# Patient Record
Sex: Male | Born: 1980
Health system: Southern US, Community
[De-identification: ages and names within clinical notes are randomized; demographics above are authoritative.]

## PROBLEM LIST (undated history)

## (undated) DIAGNOSIS — R42 Dizziness and giddiness: Secondary | ICD-10-CM

## (undated) DIAGNOSIS — R609 Edema, unspecified: Secondary | ICD-10-CM

## (undated) DIAGNOSIS — K219 Gastro-esophageal reflux disease without esophagitis: Secondary | ICD-10-CM

## (undated) HISTORY — DX: Edema, unspecified: R60.9

## (undated) HISTORY — PX: ADENOIDECTOMY: SUR15

## (undated) HISTORY — DX: Dizziness and giddiness: R42

## (undated) HISTORY — DX: Gastro-esophageal reflux disease without esophagitis: K21.9

## (undated) HISTORY — PX: TONSILLECTOMY: SUR1361

## (undated) HISTORY — DX: Morbid (severe) obesity due to excess calories: E66.01

---

## 2011-09-26 ENCOUNTER — Emergency Department (HOSPITAL_COMMUNITY): Payer: 59

## 2011-09-26 ENCOUNTER — Encounter (HOSPITAL_COMMUNITY): Payer: Self-pay | Admitting: *Deleted

## 2011-09-26 ENCOUNTER — Emergency Department (HOSPITAL_COMMUNITY)
Admission: EM | Admit: 2011-09-26 | Discharge: 2011-09-26 | Disposition: A | Discharge status: Home / Self Care | Payer: 59 | Attending: Emergency Medicine | Admitting: Emergency Medicine

## 2011-09-26 DIAGNOSIS — R110 Nausea: Secondary | ICD-10-CM | POA: Insufficient documentation

## 2011-09-26 DIAGNOSIS — R079 Chest pain, unspecified: Secondary | ICD-10-CM | POA: Insufficient documentation

## 2011-09-26 DIAGNOSIS — M25519 Pain in unspecified shoulder: Secondary | ICD-10-CM | POA: Insufficient documentation

## 2011-09-26 LAB — BASIC METABOLIC PANEL
BUN: 17 mg/dL (ref 6–23)
CO2: 21 mEq/L (ref 19–32)
Calcium: 9.2 mg/dL (ref 8.4–10.5)
Creatinine, Ser: 0.75 mg/dL (ref 0.50–1.35)
GFR calc non Af Amer: >90 mL/min (ref >90)
Glucose, Bld: 94 mg/dL (ref 70–99)

## 2011-09-26 LAB — DIFFERENTIAL
Basophils Absolute: 0 10*3/uL (ref 0.0–0.1)
Basophils Relative: 1 % (ref 0–1)
Eosinophils Absolute: 0.2 10*3/uL (ref 0.0–0.7)
Eosinophils Relative: 2 % (ref 0–5)
Monocytes Absolute: 0.5 10*3/uL (ref 0.1–1.0)
Monocytes Relative: 6 % (ref 3–12)

## 2011-09-26 LAB — CBC
HCT: 45.7 % (ref 39.0–52.0)
Hemoglobin: 16.3 g/dL (ref 13.0–17.0)
MCH: 32 pg (ref 26.0–34.0)
MCHC: 35.7 g/dL (ref 30.0–36.0)
RDW: 13.2 % (ref 11.5–15.5)

## 2011-09-26 NOTE — Discharge Instructions (Signed)
Aspirin and Your Heart Aspirin affects the way your blood clots and helps "thin" the blood. Aspirin has many uses in heart disease. It may be used as a primary prevention to help reduce the risk of heart related events. It also can be used as a secondary measure to prevent more heart attacks or to prevent additional damage from blood clots.  ASPIRIN MAY HELP IF YOU:  Have had a heart attack or chest pain.   Have undergone open heart surgery such as CABG (Coronary Artery Bypass Surgery).   Have had coronary angioplasty with or without stents.   Have experienced a stroke or TIA (transient ischemic attack).   Have peripheral vascular disease (PAD).   Have chronic heart rhythm problems such as atrial fibrillation.   Are at risk for heart disease.  BEFORE STARTING ASPIRIN Before you start taking aspirin, your caregiver will need to review your medical history. Many things will need to be taken into consideration, such as:  Smoking status.   Blood pressure.   Diabetes.   Gender.   Weight.   Cholesterol level.  ASPIRIN DOSES  Aspirin should only be taken on the advice of your caregiver. Talk to your caregiver about how much aspirin you should take. Aspirin comes in different doses such as:   81 mg.   162 mg.   325 mg.   The aspirin dose you take may be affected by many factors, some of which include:   Your current medications, especially if your are taking blood-thinners or anti-platelet medicine.   Liver function.   Heart disease risk.   Age.   Aspirin comes in two forms:   Non-enteric-coated. This type of aspirin does not have a coating and is absorbed faster. Non-enteric coated aspirin is recommended for patients experiencing chest pain symptoms. This type of aspirin also comes in a chewable form.   Enteric-coated. This means the aspirin has a special coating that releases the medicine very slowly. Enteric-coated aspirin causes less stomach upset. This type of  aspirin should not be chewed or crushed.  ASPIRIN SIDE EFFECTS Daily use of aspirin can increase your risk of serious side effects, some of these include:  Increased bleeding. This can range from a cut that does not stop bleeding to more serious problems such as stomach bleeding or bleeding into the brain (Intracerebral bleeding).   Increased bruising.   Stomach upset.   An allergic reaction such as red, itchy skin.   Increased risk of bleeding when combined with non-steroidal anti-inflammatory medicine (NSAIDS).   Alcohol should be drank in moderation when taking aspirin. Alcohol can increase the risk of stomach bleeding when taken with aspirin.   Aspirin should not be given to children less than 18 years of age due to the association of Reye syndrome. Reye syndrome is a serious illness that can affect the brain and liver. Studies have linked Reye syndrome with aspirin use in children.   People that have nasal polyps have an increased risk of developing an aspirin allergy.  SEEK MEDICAL CARE IF:   You develop an allergic reaction such as:   Hives.   Itchy skin.   Swelling of the lips, tongue or face.   You develop stomach pain.   You have unusual bleeding or bruising.   You have ringing in your ears.  SEEK IMMEDIATE MEDICAL CARE IF:   You have severe chest pain, especially if the pain is crushing or pressure-like and spreads to the arms, back, neck, or jaw. THIS   IS AN EMERGENCY. Do not wait to see if the pain will go away. Get medical help at once. Call your local emergency services (911 in the [place] DO NOT drive yourself to the hospital.   You have stroke-like symptoms such as:   Loss of vision.   Difficulty talking.   Numbness or weakness on one side of your body.   Numbness or weakness in your arm or leg.   Not thinking clearly or feeling confused.   Your bowel movements are bloody, dark red or black in color.   You vomit or cough up blood.   You have blood  in your urine.   You have shortness of breath, coughing or wheezing.  MAKE SURE YOU:   Understand these instructions.   Will monitor your condition.   Seek immediate medical care if necessary.  Document Released: 05/06/2008 Document Revised: 05/13/2011 Document Reviewed: 05/06/2008 [facility] Patient Information 2012 [place], [place].Chest Pain, Nonspecific It is often hard to give a specific diagnosis for the cause of chest pain. There is always a chance that your pain could be related to something serious, like a heart attack or a blood clot in the lungs. You need to follow up with your caregiver for further evaluation. More lab tests or other studies such as X-rays, electrocardiography, stress testing, or cardiac imaging may be needed to find the cause of your pain. Most of the time, nonspecific chest pain improves within 2 to 3 days with rest and mild pain medicine. For the next few days, avoid physical exertion or activities that bring on pain. Do not smoke. Avoid drinking alcohol. Call your caregiver for routine follow-up as advised.  SEEK IMMEDIATE MEDICAL CARE IF:  You develop increased chest pain or pain that radiates to the arm, neck, jaw, back, or abdomen.   You develop shortness of breath, increased coughing, or you start coughing up blood.   You have severe back or abdominal pain, nausea, or vomiting.   You develop severe weakness, fainting, fever, or chills.  Document Released: 05/24/2005 Document Revised: 05/13/2011 Document Reviewed: 11/11/2006 [facility] Patient Information 2012 [place], [place].

## 2011-09-26 NOTE — ED Notes (Signed)
Patient transported to X-ray 

## 2011-09-26 NOTE — ED Notes (Signed)
Returned from radiology. 

## 2011-09-26 NOTE — ED Notes (Signed)
Reports having onset of diaphoresis, dizziness and left side chest cramping. Pain has decreased pta, skin w/d. resp e/u at triage, ekg done.

## 2011-09-26 NOTE — ED Provider Notes (Addendum)
History     CSN: [id number]  Arrival date & time 09/26/11  1351   First MD Initiated Contact with Patient 09/26/11 1449      Chief Complaint  Patient presents with  . Chest Pain    (Consider location/radiation/quality/duration/timing/severity/associated sxs/prior treatment) Patient is a 31 y.o. male presenting with chest pain. The history is provided by the patient.  Chest Pain The chest pain began 3 - 5 hours ago. Chest pain occurs constantly. The chest pain is improving. At its most intense, the pain is at 8/10. The pain is currently at 1/10. The severity of the pain is moderate. The quality of the pain is described as aching (Feels like a cramp). The pain does not radiate. Exacerbated by: Started about one hour after he ate while he was walking around. He became lightheaded and sweaty. Primary symptoms include nausea and dizziness. Pertinent negatives for primary symptoms include no fever, no shortness of breath, no cough, no wheezing, no palpitations, no abdominal pain and no vomiting.  Dizziness also occurs with nausea and diaphoresis. Dizziness does not occur with vomiting.  Associated symptoms include diaphoresis and near-syncope. He tried aspirin for the symptoms. Risk factors include smoking/tobacco exposure.  Pertinent negatives for past medical history include no CAD, no hyperlipidemia and no hypertension.  His family medical history is significant for CAD in family and early MI in family.     History reviewed. No pertinent past medical history.  History reviewed. No pertinent past surgical history.  History reviewed. No pertinent family history.  History  Substance Use Topics  . Smoking status: Current Everyday Smoker -- 1.0 packs/day    Types: Cigarettes  . Smokeless tobacco: Not on file  . Alcohol Use: Yes     occ      Review of Systems  Constitutional: Positive for diaphoresis. Negative for fever.  Respiratory: Negative for cough, shortness of breath and  wheezing.   Cardiovascular: Positive for chest pain and near-syncope. Negative for palpitations.  Gastrointestinal: Positive for nausea. Negative for vomiting and abdominal pain.  Neurological: Positive for dizziness.  All other systems reviewed and are negative.    Allergies  Doxycycline  Home Medications   Current Outpatient Rx  Name Route Sig Dispense Refill  . ASPIRIN 325 MG PO TABS Oral Take 325 mg by mouth once.    . IBUPROFEN 200 MG PO TABS Oral Take 200 mg by mouth every 6 (six) hours as needed. For pain.    . ADULT MULTIVITAMIN W/MINERALS CH Oral Take 1 tablet by mouth daily.      BP 174/104  Pulse 107  Temp(Src) 97.5 F (36.4 C) (Oral)  Resp 18  SpO2 96%  Physical Exam  Nursing note and vitals reviewed. Constitutional: He is oriented to person, place, and time. He appears well-developed and well-nourished. No distress.  HENT:  Head: Normocephalic and atraumatic.  Mouth/Throat: Oropharynx is clear and moist.  Eyes: Conjunctivae and EOM are normal. Pupils are equal, round, and reactive to light.  Neck: Normal range of motion. Neck supple.  Cardiovascular: Normal rate, regular rhythm and intact distal pulses.   No murmur heard. Pulmonary/Chest: Effort normal and breath sounds normal. No respiratory distress. He has no wheezes. He has no rales.  Abdominal: Soft. He exhibits no distension. There is no tenderness. There is no rebound and no guarding.  Musculoskeletal: Normal range of motion. He exhibits no edema and no tenderness.  Neurological: He is alert and oriented to person, place, and time.  Skin: Skin  is warm and dry. No rash noted. No erythema.  Psychiatric: He has a normal mood and affect. His behavior is normal.    ED Course  Procedures (including critical care time)  Labs Reviewed  BASIC METABOLIC PANEL - Abnormal; Notable for the following:    Sodium 128 (*)    Potassium >7.5 (*)    All other components within normal limits  CBC  DIFFERENTIAL    POCT I-STAT TROPONIN I   Dg Chest 2 View  09/26/2011  *RADIOLOGY REPORT*  Clinical Data: Left-sided chest and shoulder pain.  Dizziness and swelling.  History of smoking.  CHEST - 2 VIEW  Comparison: None.  Findings: Heart size is upper limits normal.  The lungs are free of focal consolidations and pleural effusions.  There is minimal left base atelectasis.  No edema. Visualized osseous structures have a normal appearance.  IMPRESSION:  1.  Minimal left lower lobe atelectasis and/or early infiltrate. 2.  No edema.  Original Report Authenticated By: Patterson Hammersmith, M.D.     Date: 09/26/2011  Rate: 101  Rhythm: sinus tachycardia  QRS Axis: normal  Intervals: normal  ST/T Wave abnormalities: normal  Conduction Disutrbances:none  Narrative Interpretation:   Old EKG Reviewed: none available    1. Chest pain       MDM   Pt with symptoms concerning for ACS today with chest pain, diaphoresis and near-syncope.  TIMI 0 however has a strong family history of multiple people in his family including both parents had heart attacks and heart disease.  PERC negative.  Patient is low risk however he has a good story. Recommended he do the low risk chest pain protocol. Patient denies any drug use and does not use muscle enhancing medications EKG within normal limits, CXR, CBC, BMP, CE pending.  Troponin negative. CBC within normal limits. Chest x-ray within normal limits. BMP was hemolyzed however has a normal creatinine feel that the hyperkalemia is to to the blood being hemolyzed. Patient had multiple samples overall hemolyzed. Also patient had a high content of lipids and his blood which is yet another risk factor. All of this was discussed with him including his test results. I strongly urged him to go into the low risk chest pain protocol for coronary CT however he refused and wanted to go home. He understands that he could have a problem with his heart that to get worse but still chooses to go  home and followup as an outpatient.       Gwyneth Sprout, MD 09/26/11 1714  Gwyneth Sprout, MD 09/26/11 [phone]

## 2011-09-28 ENCOUNTER — Encounter: Payer: Self-pay | Admitting: Cardiovascular Disease

## 2011-09-28 ENCOUNTER — Ambulatory Visit (INDEPENDENT_AMBULATORY_CARE_PROVIDER_SITE_OTHER): Payer: 59 | Admitting: Cardiovascular Disease

## 2011-09-28 ENCOUNTER — Encounter: Payer: Self-pay | Admitting: *Deleted

## 2011-09-28 DIAGNOSIS — I10 Essential (primary) hypertension: Secondary | ICD-10-CM

## 2011-09-28 DIAGNOSIS — F1010 Alcohol abuse, uncomplicated: Secondary | ICD-10-CM | POA: Insufficient documentation

## 2011-09-28 DIAGNOSIS — R079 Chest pain, unspecified: Secondary | ICD-10-CM

## 2011-09-28 DIAGNOSIS — I200 Unstable angina: Secondary | ICD-10-CM | POA: Insufficient documentation

## 2011-09-28 DIAGNOSIS — F17200 Nicotine dependence, unspecified, uncomplicated: Secondary | ICD-10-CM | POA: Insufficient documentation

## 2011-09-28 HISTORY — DX: Essential (primary) hypertension: I10

## 2011-09-28 LAB — BASIC METABOLIC PANEL
BUN: 19 mg/dL (ref 6–23)
Creatinine, Ser: 0.9 mg/dL (ref 0.4–1.5)
GFR: 107.82 mL/min (ref >60.00)
Glucose, Bld: 80 mg/dL (ref 70–99)
Potassium: 3.8 mEq/L (ref 3.5–5.1)

## 2011-09-28 MED ORDER — LOSARTAN POTASSIUM 25 MG PO TABS: 25.0000 mg | ORAL_TABLET | Freq: Every day | ORAL | Status: DC

## 2011-09-28 NOTE — Assessment & Plan Note (Signed)
Counseled on cessation for less than 10 minutes.  Indicated relationship between nicotine and HTN

## 2011-09-28 NOTE — Assessment & Plan Note (Addendum)
Not abuse.  Epic has no category for ETOH use:  However he drinks most days and I indicated this would make his BP harder to control

## 2011-09-28 NOTE — Patient Instructions (Addendum)
Your physician has requested that you have an exercise tolerance test in 2-3 weeks. For further information please visit [email or web address] Please also follow instruction sheet, as given.  BMET today.  Start Cozaar 25mg  daily.  Your physician recommends that you schedule a follow-up appointment in: 3 months with Dr. Eden Emms.

## 2011-09-28 NOTE — Assessment & Plan Note (Signed)
Atypical R/O in ER Normal ECG  F/U ETT once on BP meds

## 2011-09-28 NOTE — Assessment & Plan Note (Signed)
Repeat BMET that had low sodium and high K in ER.  Cozaar 25 mg if BMET ok

## 2011-09-28 NOTE — Progress Notes (Signed)
Patient ID: Jacob Carlson, male   DOB: Jul 07, 1980, 31 y.o.   MRN: [id number] 31 yo with no PCP.  Seen in ER 2 days ago for SSCP.  R/O with no ECG changes.  Reviewed ER notes.  Declined to by entered into chest pain protocol.  Of note Na/K were abnormal and attributed to hemolysis.  Pain started after eating at [employer].  Had diaphoresis.  No dyspnea.  Heart was pounding.  Positive family history with both parents MI's in 80's.  Still with mild residual left pectoral pain.  No trauma or muscle strains.  No other GI symptoms.  Denies drugs.  Does drink most days and more on weekends.  Smokes 1-1.5 ppd  ROS: Denies fever, malais, weight loss, blurry vision, decreased visual acuity, cough, sputum, SOB, hemoptysis, pleuritic pain, palpitaitons, heartburn, abdominal pain, melena, lower extremity edema, claudication, or rash.  All other systems reviewed and negative   General: Affect appropriate Healthy:  appears stated age HEENT: normal Neck supple with no adenopathy JVP normal no bruits no thyromegaly Lungs clear with no wheezing and good diaphragmatic motion Heart:  S1/S2 no murmur,rub, gallop or click PMI normal Abdomen: benighn, BS positve, no tenderness, no AAA no bruit.  No HSM or HJR Distal pulses intact with no bruits No edema Neuro non-focal Skin warm and dry No muscular weakness  Medications Current Outpatient Prescriptions  Medication Sig Dispense Refill  . aspirin 325 MG tablet Take 325 mg by mouth once.      Marland Kitchen ibuprofen (ADVIL,MOTRIN) 200 MG tablet Take 200 mg by mouth every 6 (six) hours as needed. For pain.      . Multiple Vitamin (MULITIVITAMIN WITH MINERALS) TABS Take 1 tablet by mouth daily.        Allergies Doxycycline  Family History: Family History  Problem Relation Age of Onset  . Heart attack Father   . Heart block Father   . Heart disease Mother     Social History: History   Social History  . Marital Status: Significant Other    Spouse Name: N/A   Number of Children: N/A  . Years of Education: N/A   Occupational History  . Not on file.   Social History Main Topics  . Smoking status: Current Everyday Smoker -- 1.0 packs/day    Types: Cigarettes  . Smokeless tobacco: Not on file  . Alcohol Use: Yes     occ  . Drug Use: No  . Sexually Active:    Other Topics Concern  . Not on file   Social History Narrative  . No narrative on file    Electrocardiogram:  09/26/11 NSR normal ECG  Assessment and Plan

## 2011-10-22 ENCOUNTER — Ambulatory Visit (INDEPENDENT_AMBULATORY_CARE_PROVIDER_SITE_OTHER): Payer: 59 | Admitting: Physician Assistant

## 2011-10-22 ENCOUNTER — Encounter: Payer: Self-pay | Admitting: Physician Assistant

## 2011-10-22 DIAGNOSIS — R079 Chest pain, unspecified: Secondary | ICD-10-CM

## 2011-10-22 NOTE — Procedures (Signed)
Exercise Treadmill Test  Pre-Exercise Testing Evaluation Rhythm: sinus tachycardia  Rate: 104   PR:  .12 QRS:  .08  QT:  .32 QTc: .43     Test  Exercise Tolerance Test Ordering MD: Charlton Haws, MD  Interpreting MD: Tereso Newcomer PA-C  Unique Test No: 1  Treadmill:  1  Indication for ETT: chest pain - rule out ischemia  Contraindication to ETT: No   Stress Modality: exercise - treadmill  Cardiac Imaging Performed: non   Protocol: standard Bruce - maximal  Max BP:  182/48  Max MPHR (bpm):  190 85% MPR (bpm):  161  MPHR obtained (bpm):  169 % MPHR obtained:  88%  Reached 85% MPHR (min:sec):  7:58 Total Exercise Time (min-sec):  9:00  Workload in METS:  10.1 Borg Scale: 15  Reason ETT Terminated:  patient's desire to stop    ST Segment Analysis At Rest: normal ST segments - no evidence of significant ST depression With Exercise: no evidence of significant ST depression  Other Information Arrhythmia:  No Angina during ETT:  absent (0) Quality of ETT:  diagnostic  ETT Interpretation:  normal - no evidence of ischemia by ST analysis  Comments: Good exercise tolerance. No chest pain. Normal BP response to exercise. No ST-T changes to suggest ischemia.   Recommendations: Follow up with Dr. Charlton Haws as directed. Tereso Newcomer, PA-C  12:21 PM 10/22/2011

## 2011-12-23 ENCOUNTER — Ambulatory Visit: Payer: 59 | Admitting: Cardiovascular Disease

## 2012-03-04 DIAGNOSIS — E782 Mixed hyperlipidemia: Secondary | ICD-10-CM

## 2012-03-04 HISTORY — DX: Mixed hyperlipidemia: E78.2

## 2013-04-21 IMAGING — CR DG CHEST 2V
2 series · 2 of 2 positions shown · non-contrast
Comparison: None.

CLINICAL DATA: Left-sided chest and shoulder pain.  Dizziness and
swelling.  History of smoking.

CHEST - 2 VIEW

[w chest pa]
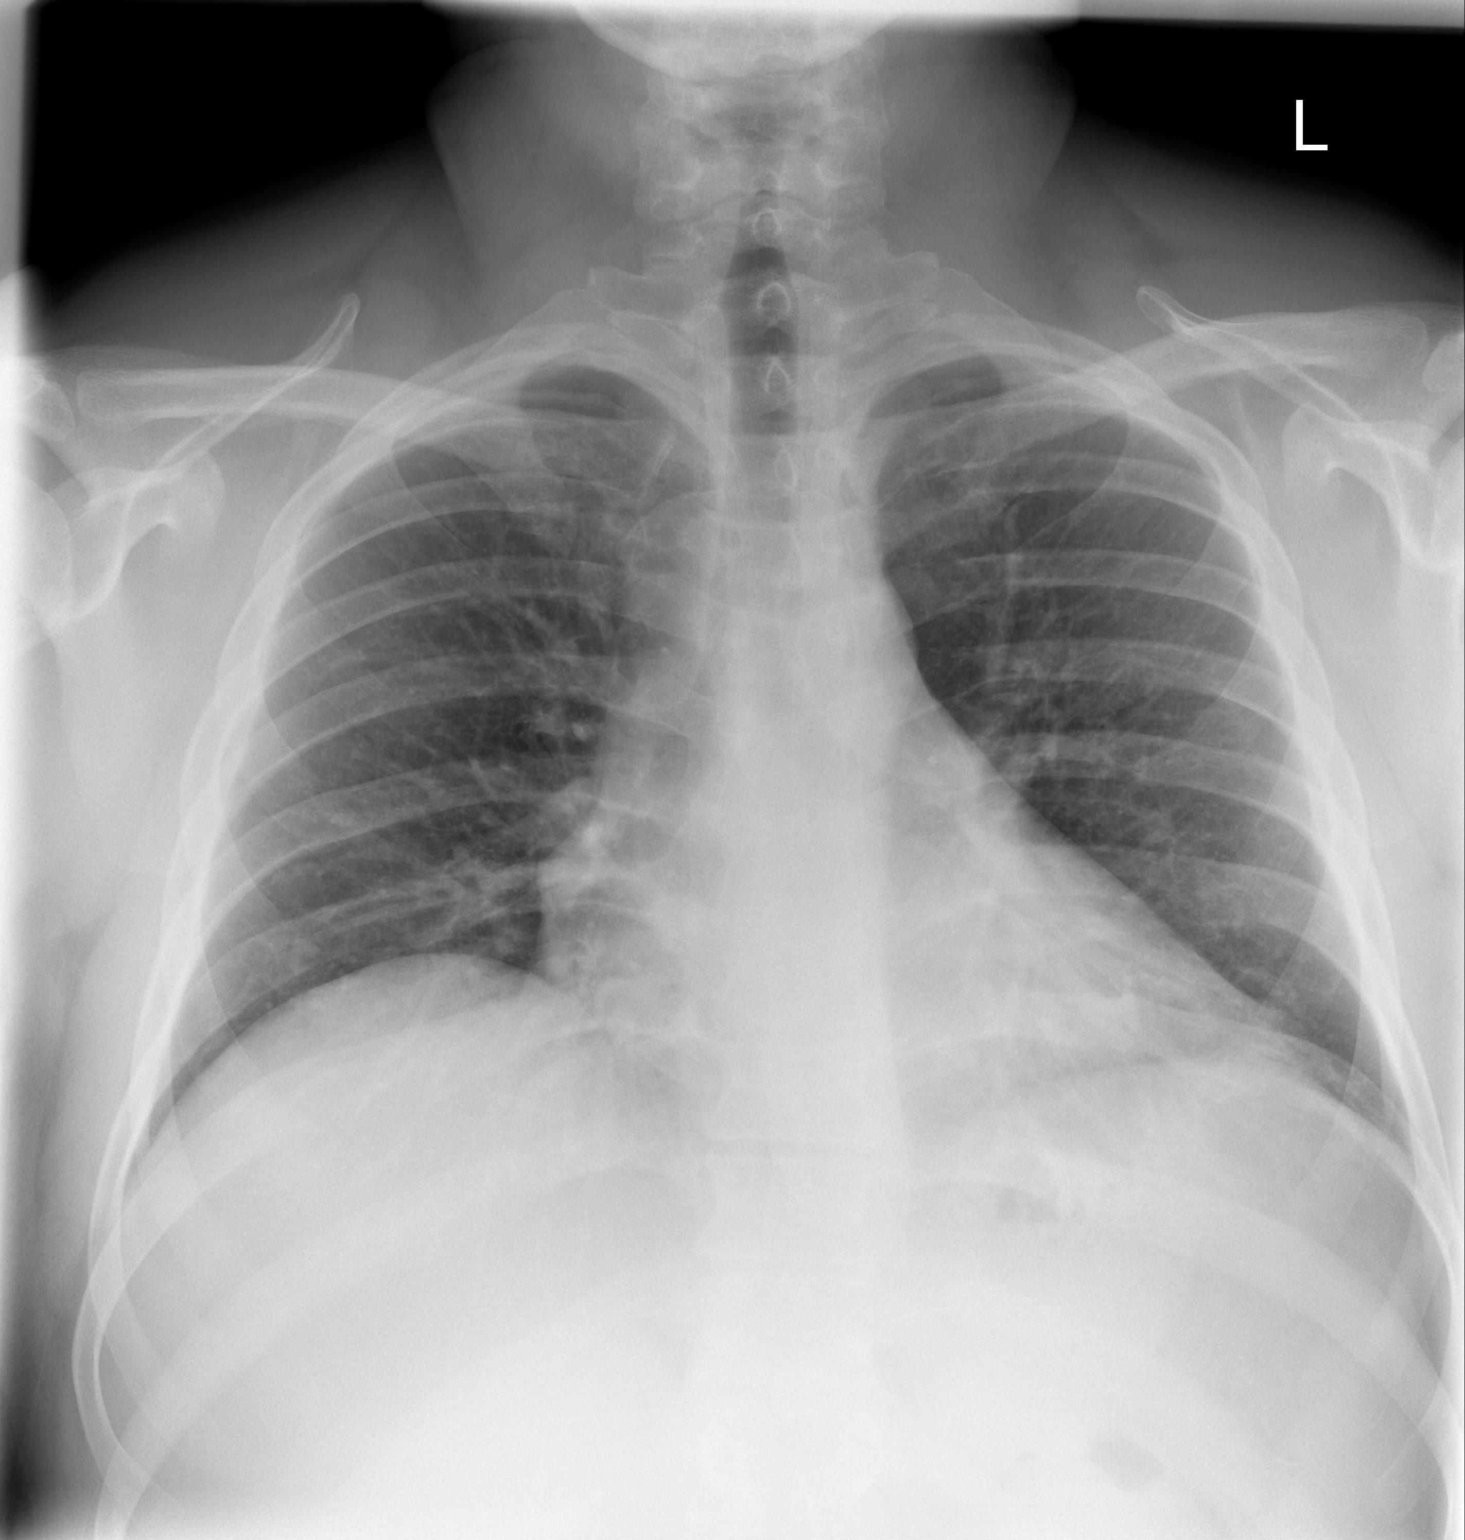

[w chest lat]
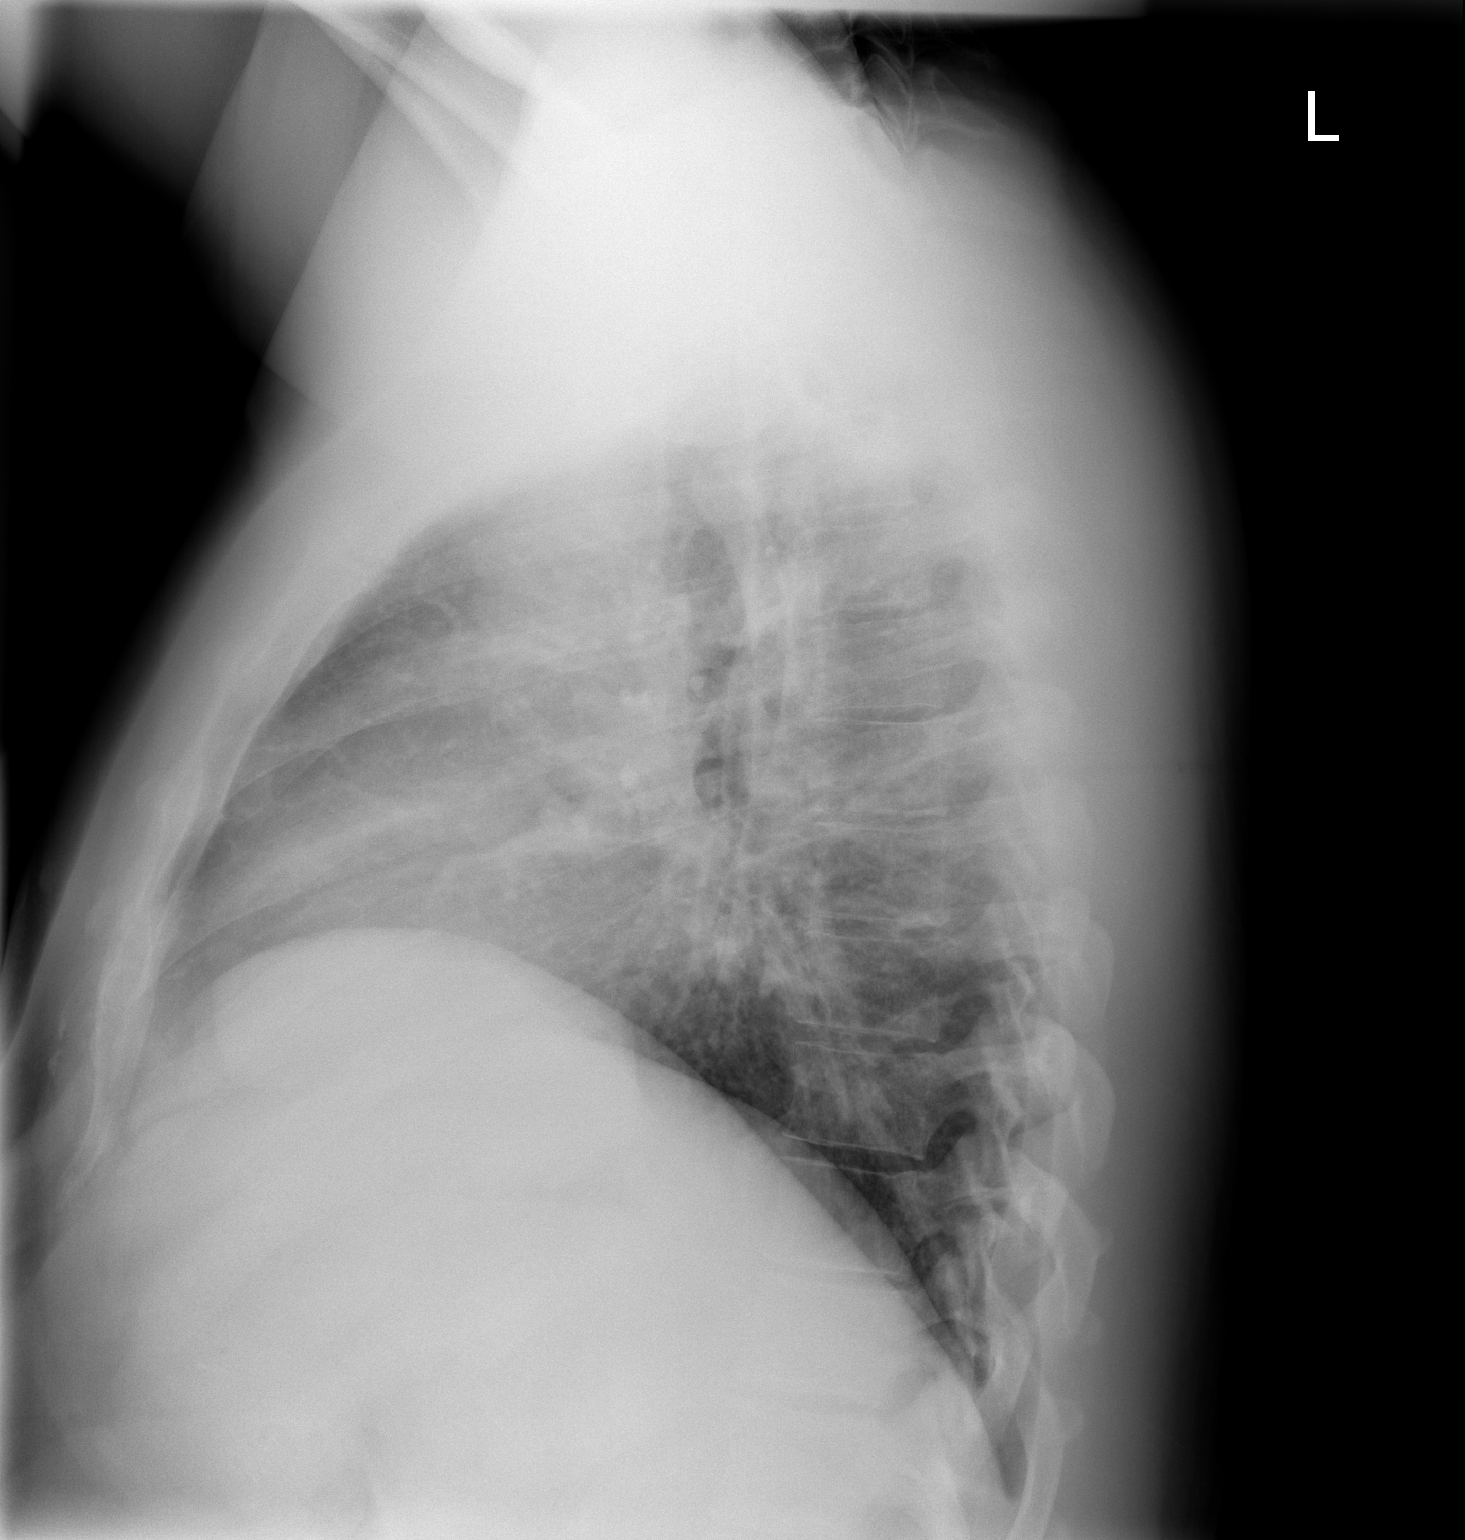

[2 of 2 positions shown; findings below may reference images not displayed]

FINDINGS: Heart size is upper limits normal.  The lungs are free of
focal consolidations and pleural effusions.  There is minimal left
base atelectasis.  No edema. Visualized osseous structures have a
normal appearance.
IMPRESSION: 1.  Minimal left lower lobe atelectasis and/or early infiltrate.
2.  No edema.

## 2017-09-16 DIAGNOSIS — Z0000 Encounter for general adult medical examination without abnormal findings: Secondary | ICD-10-CM | POA: Diagnosis not present

## 2017-09-16 DIAGNOSIS — I10 Essential (primary) hypertension: Secondary | ICD-10-CM | POA: Diagnosis not present

## 2017-09-16 DIAGNOSIS — E781 Pure hyperglyceridemia: Secondary | ICD-10-CM | POA: Diagnosis not present

## 2017-09-16 DIAGNOSIS — E782 Mixed hyperlipidemia: Secondary | ICD-10-CM | POA: Diagnosis not present

## 2018-04-09 DIAGNOSIS — Z23 Encounter for immunization: Secondary | ICD-10-CM | POA: Diagnosis not present

## 2018-05-13 DIAGNOSIS — M9903 Segmental and somatic dysfunction of lumbar region: Secondary | ICD-10-CM | POA: Diagnosis not present

## 2018-05-13 DIAGNOSIS — M62830 Muscle spasm of back: Secondary | ICD-10-CM | POA: Diagnosis not present

## 2018-05-13 DIAGNOSIS — M5442 Lumbago with sciatica, left side: Secondary | ICD-10-CM | POA: Diagnosis not present

## 2018-05-15 DIAGNOSIS — M62830 Muscle spasm of back: Secondary | ICD-10-CM | POA: Diagnosis not present

## 2018-05-15 DIAGNOSIS — M5442 Lumbago with sciatica, left side: Secondary | ICD-10-CM | POA: Diagnosis not present

## 2018-05-15 DIAGNOSIS — M9903 Segmental and somatic dysfunction of lumbar region: Secondary | ICD-10-CM | POA: Diagnosis not present

## 2018-05-16 DIAGNOSIS — M62830 Muscle spasm of back: Secondary | ICD-10-CM | POA: Diagnosis not present

## 2018-05-16 DIAGNOSIS — M5442 Lumbago with sciatica, left side: Secondary | ICD-10-CM | POA: Diagnosis not present

## 2018-05-16 DIAGNOSIS — M9903 Segmental and somatic dysfunction of lumbar region: Secondary | ICD-10-CM | POA: Diagnosis not present

## 2018-05-18 DIAGNOSIS — M5442 Lumbago with sciatica, left side: Secondary | ICD-10-CM | POA: Diagnosis not present

## 2018-05-18 DIAGNOSIS — M62830 Muscle spasm of back: Secondary | ICD-10-CM | POA: Diagnosis not present

## 2018-05-18 DIAGNOSIS — M9903 Segmental and somatic dysfunction of lumbar region: Secondary | ICD-10-CM | POA: Diagnosis not present

## 2018-05-23 DIAGNOSIS — M62830 Muscle spasm of back: Secondary | ICD-10-CM | POA: Diagnosis not present

## 2018-05-23 DIAGNOSIS — M5442 Lumbago with sciatica, left side: Secondary | ICD-10-CM | POA: Diagnosis not present

## 2018-05-23 DIAGNOSIS — M9903 Segmental and somatic dysfunction of lumbar region: Secondary | ICD-10-CM | POA: Diagnosis not present

## 2022-03-17 ENCOUNTER — Ambulatory Visit: Payer: 59 | Attending: Cardiology | Admitting: Cardiology

## 2022-03-17 ENCOUNTER — Encounter: Payer: Self-pay | Admitting: Cardiology

## 2022-03-17 VITALS — BP 146/100 | HR 81 | Ht 72.0 in | Wt 327.6 lb

## 2022-03-17 DIAGNOSIS — I200 Unstable angina: Secondary | ICD-10-CM | POA: Diagnosis not present

## 2022-03-17 DIAGNOSIS — I10 Essential (primary) hypertension: Secondary | ICD-10-CM | POA: Diagnosis not present

## 2022-03-17 DIAGNOSIS — R0609 Other forms of dyspnea: Secondary | ICD-10-CM

## 2022-03-17 DIAGNOSIS — Z6841 Body Mass Index (BMI) 40.0 and over, adult: Secondary | ICD-10-CM

## 2022-03-17 DIAGNOSIS — F1010 Alcohol abuse, uncomplicated: Secondary | ICD-10-CM

## 2022-03-17 DIAGNOSIS — E782 Mixed hyperlipidemia: Secondary | ICD-10-CM | POA: Insufficient documentation

## 2022-03-17 DIAGNOSIS — F17200 Nicotine dependence, unspecified, uncomplicated: Secondary | ICD-10-CM

## 2022-03-17 DIAGNOSIS — Z8249 Family history of ischemic heart disease and other diseases of the circulatory system: Secondary | ICD-10-CM | POA: Insufficient documentation

## 2022-03-17 LAB — CBC
Hematocrit: 46.9 % (ref 37.5–51.0)
Hemoglobin: 16.1 g/dL (ref 13.0–17.7)
MCH: 29.5 pg (ref 26.6–33.0)
MCHC: 34.3 g/dL (ref 31.5–35.7)
MCV: 86 fL (ref 79–97)
Platelets: 150 10*3/uL (ref 150–450)
RBC: 5.45 x10E6/uL (ref 4.14–5.80)
RDW: 12.3 % (ref 11.6–15.4)
WBC: 7.2 10*3/uL (ref 3.4–10.8)

## 2022-03-17 LAB — BASIC METABOLIC PANEL
BUN/Creatinine Ratio: 17 (ref 9–20)
BUN: 14 mg/dL (ref 6–24)
CO2: 21 mmol/L (ref 20–29)
Calcium: 9.6 mg/dL (ref 8.7–10.2)
Chloride: 103 mmol/L (ref 96–106)
Creatinine, Ser: 0.82 mg/dL (ref 0.76–1.27)
Glucose: 101 mg/dL — ABNORMAL HIGH (ref 70–99)
Potassium: 4.3 mmol/L (ref 3.5–5.2)
Sodium: 141 mmol/L (ref 134–144)
eGFR: 114 mL/min/{1.73_m2} (ref >59)

## 2022-03-17 MED ORDER — CHLORTHALIDONE 25 MG PO TABS: 25.0000 mg | ORAL_TABLET | Freq: Every day | ORAL | 3 refills | Status: DC

## 2022-03-17 MED ORDER — FENOFIBRATE 134 MG PO CAPS: 134.0000 mg | ORAL_CAPSULE | Freq: Every day | ORAL | 11 refills | Status: DC

## 2022-03-17 MED ORDER — NITROGLYCERIN 0.4 MG SL SUBL: 0.4000 mg | SUBLINGUAL_TABLET | SUBLINGUAL | 5 refills | Status: AC | PRN

## 2022-03-17 NOTE — Assessment & Plan Note (Signed)
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  We will also check lipid LP(a) as another risk factor.  Were planning ischemic evaluation with cardiac catheterization but also referral to lipid clinic to see Dr. 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

## 2022-03-17 NOTE — Assessment & Plan Note (Signed)
Poorly controlled hypertension with ongoing symptoms.  Currently on current carvedilol 25 mg twice daily and losartan 50 mg twice daily  With diastolic pressures in the 90s to 100s, plan to initiate diuretic with chlorthalidone 25 mg daily.  Check baseline labs now which will also repeat precath labs, will then also recheck labs in about 2 weeks.

## 2022-03-17 NOTE — Patient Instructions (Addendum)
Medication Instructions:   Start taking fenofibrate 134 mg     one tablet daily    Start taking chlorthalidone 25 mg one tablet daily   May use Nitroglycerin 0.4 mg - one tablet under your tongue  for chest discomfort - see instruction below.   *If you need a refill on your cardiac medications before your next appointment, please call your pharmacy*   Lab Work:  Cbc Bmp  Will do lipid in the hospital before your cardiac cath  If you have labs (blood work) drawn today and your tests are completely normal, you will receive your results only by: [place] (if you have MyChart) OR A paper copy in the mail If you have any lab test that is abnormal or we need to change your treatment, we will call you to review the results.   Testing/Procedures:  Will be schedule at [facility] has requested that you have an echocardiogram. Echocardiography is a painless test that uses sound waves to create images of your heart. It provides your doctor with information about the size and shape of your heart and how well your heart's chambers and valves are working. This procedure takes approximately one hour. There are no restrictions for this procedure.    Follow-Up: At [facility], you and your health needs are our priority.  As part of our continuing mission to provide you with exceptional heart care, we have created designated Provider Care Teams.  These Care Teams include your primary Cardiologist (physician) and Advanced Practice Providers (APPs -  Physician Assistants and Nurse Practitioners) who all work together to provide you with the care you need, when you need it.  We recommend signing up for the patient portal called "MyChart".  Sign up information is provided on this After Visit Summary.  MyChart is used to connect with patients for Virtual Visits (Telemedicine).  Patients are able to view lab/test results, encounter notes, upcoming  appointments, etc.  Non-urgent messages can be sent to your provider as well.   To learn more about what you can do with MyChart, go to [email or web address].    Your next appointment:   1 month(s)  The format for your next appointment:   In Person  Provider:   Glenetta Hew, MD    Other Instructions          Cardiac/Peripheral Catheterization   You are scheduled for a Cardiac Catheterization on Friday, October 13 with Dr. Glenetta Hew.  1. Please arrive at the Main Entrance A at [facility]: [address], [place] [address] on October 13 at 8:30 AM (This time is two hours before your procedure to ensure your preparation). Free valet parking service is available. You will check in at ADMITTING. The support person will be asked to wait in the waiting room.  It is OK to have someone drop you off and come back when you are ready to be discharged.        Special note: Every effort is made to have your procedure done on time. Please understand that emergencies sometimes delay scheduled procedures.   . 2. Diet: Do not eat solid foods after midnight.  You may have clear liquids until 5 AM the day of the procedure.  3. Labs: You will need to have blood drawn  CBC,BMP on Wednesday, October 11 at [place], [place]  Open: [place] (Lunch 12:30 - 1:30)   Phone: [phone].  You do not need to be fasting.  4. Medication instructions in preparation for your procedure:   Contrast Allergy: No   Stop taking, Cozaar (Losartan) Friday, October 13, only    On the morning of your procedure, take Aspirin 81 mg and any morning medicines NOT listed above.  You may use sips of water.  5. Plan to go home the same day, you will only stay overnight if medically necessary. 6. You MUST have a responsible adult to drive you home. 7. An adult MUST be with you the first 24 hours after you arrive home. 8. Bring a current list of your medications, and  the last time and date medication taken. 9. Bring ID and current insurance cards. 10.Please wear clothes that are easy to get on and off and wear slip-on shoes.  Thank you for allowing [place] to care for you!   -- [facility]

## 2022-03-17 NOTE — H&P (View-Only) (Signed)
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  It happens both at rest and with exertion but definitely exacerbated by exertion.  He has a chest heaviness sensation but he also has left arm pain.  Somewhat concerning with all of his cardiac risk factors including severely elevated triglycerides and family history there is very prominent in his mother side for early CAD.  We talked about either direct cardiac catheterization versus coronary CT angiogram for ischemic evaluation.  Based on the progressive nature of symptoms, we decided with shared decision making that the best course of action would be to proceed directly with cardiac catheterization which will be scheduled for later this week.  See informed decision-making consent below.  Plan: Left Heart Catheterization with Coronary Angiography and Possible Percutaneous Coronary Intervention ->.  NTG as needed Already taking aspirin and low-dose beta-blocker.  Also high-dose beta-blocker.      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  He was just started on rosuvastatin 10 mg which he can continue, but he needs to be on a fibrate as well.  We will start fenofibrate high-dose and referred to Dr. Debara Pickett advanced lipid clinic.  We will also recheck lipids along with LP(a) & HgbA1c as part of precath labs.      Relevant Medications   rosuvastatin (CRESTOR) 10 MG tablet   losartan (COZAAR) 50 MG tablet   carvedilol (COREG) 25 MG tablet   fenofibrate micronized (LOFIBRA) 134 MG capsule   chlorthalidone (HYGROTON) 25 MG tablet   nitroGLYCERIN (NITROSTAT) 0.4 MG SL tablet   Other Relevant Orders   Basic metabolic panel (Completed)   CBC (Completed)   ECHOCARDIOGRAM COMPLETE   AMB Referral to [facility]   Essential hypertension (Chronic)    Poorly controlled hypertension with ongoing symptoms.  Currently on current carvedilol 25 mg twice daily and losartan 50 mg twice daily  With diastolic pressures in the 90s to 100s, plan to initiate diuretic with chlorthalidone 25 mg daily.  Check baseline labs now which will also repeat precath labs, will then also recheck labs in about 2 weeks.      Relevant Medications   rosuvastatin (CRESTOR) 10 MG tablet   losartan (COZAAR) 50 MG tablet   carvedilol (COREG) 25 MG tablet   fenofibrate micronized (LOFIBRA) 134 MG capsule   chlorthalidone (HYGROTON) 25 MG tablet   nitroGLYCERIN (NITROSTAT) 0.4 MG SL tablet   Other Relevant Orders   Basic metabolic panel (Completed)     Other   DOE (dyspnea on exertion)    Probably combination of deconditioning but could also  be part of his anginal equivalent.  He is also obese and not very active with moderate smoking history.  Plan:   Check 2D Echo & Left Heart Cath- Coronary Angiography with possible Percutaneous Coronary Intervention      Relevant Orders   Basic metabolic panel (Completed)   CBC (Completed)   ECHOCARDIOGRAM COMPLETE   AMB Referral to [facility]    Family history of premature CAD (Chronic)    Pretty significant family history of CAD which may very well be related to familial hypertriglyceridemia.  We will also check lipid LP(a) as another risk factor.  Were planning ischemic evaluation with cardiac catheterization but also referral to lipid clinic to see Dr. 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      Relevant Orders   Basic metabolic panel (Completed)   CBC (Completed)   ECHOCARDIOGRAM COMPLETE   AMB Referral to [facility]   Morbid obesity with BMI of 40.0-44.9, adult ([place])    Clearly his weight is playing a role in his exertional dyspnea as well as hypertension and hyperlipidemia.  We discussed dietary modifications and need for increased exercise, but with his ongoing chest pain symptoms, we need to exclude significant CAD first.  Once we can determine the presence or absence of coronary disease and treatment accordingly, would then recommend gradually increasing his exercise level as well as adopting a new diet.  Could very well benefit from dietary counseling.      Relevant Orders   Basic metabolic panel (Completed)   CBC (Completed)   ECHOCARDIOGRAM COMPLETE   AMB Referral to [facility]   ETOH abuse    Pretty significant alcohol use based on his report.  He has significantly cut down to the point where he is barely drinking now.  Since he has done that, his chest pain symptoms have improved a lot.  He had a few several near syncopal episodes about a year ago that can probably be attributed to being dehydrated and somewhat hung over from heavy night of drinking then going into a church which can be somewhat stuffy.  He felt the classic symptoms of of syncope which is probably related to dehydration and alcohol combination.      Relevant Orders   Basic metabolic panel (Completed)   CBC (Completed)   ECHOCARDIOGRAM COMPLETE   AMB Referral to Advanced  Lip[facility] Disorders Clinic   Smoking (Chronic)    Thankfully, he seems to successfully quit smoking several years ago.      ===================================  HPI:    Jacob Carlson is a morbidly obese 41 y.o. male former smoker with a PMH notable for HTN, Hyperlipidemia/hypertriglyceridemia (Severe TG elevation), EtOH abuse as well as family history of CAD who is being seen today for the evaluation of CHEST PAIN/HEAVINESS at the request of Seward Carol, MD.  Hospitalizations:  09/26/2011 -> last ER visit with 3 to 5 hours of chest pain beginning 8/10 at the worst.  Felt like an aching cramp-like sensation with no radiation that began about an hour after eating.  He became lightheaded dizzy and diaphoretic.  Troponin was negative (0.02) EKG was relatively normal.  After initial evaluation was negative, but patient declined staying for Coronary CTA. Seen in follow-up by Dr. Johnsie Cancel on 09/28/2011.  GXT ordered and started on losartan.  Plan was for follow-up in 3 months but he did not return.  [name] was referred by Dr. Delfina Redwood after being seen on  February 22, 2022 as a follow-up from urgent care evaluation with accelerated hypertension.  Blood pressure looks much better after initiation of BP meds.  Described recurrent chest pain/heaviness and arm discomfort occurring with exertion-walking up a hill.  Also occurs when eating. => Was started on aspirin and PPI (Nexium 40 mg).  Referred cardiology: Was on carvedilol 25mg  twice daily and losartan 50 mg?  Twice daily    Reviewed  CV studies:    The following studies were reviewed today: (if available, images/films reviewed: From Epic Chart or Care Everywhere) ETT 10/22/2011: good exercise tolerance-exercised for 7:58 minutes-max HR 161 (88% MPHR-10.1 METS), no chest pain.  Normal BP/HR response.  No ST and T wave changes to suggest ischemia.  Interval History:   TAYVEN FURE presents here today with complaints of chest pain and arm  pain. He said it all began about a year ago he had several episodes happening with his life.  Someone close to the mid diet and he went out the night for the funeral with his friends and have a drinking quite heavily.  When he got to the funeral he felt hot, flushed and nauseated with a headache and blurry vision.  His blood pressure at the time was 190/50 and he was not currently on medications at that time.  He then also had a couple other days where he felt pretty bad the day after drinking heavily with chest pain and hot flashes.  Apparently he had been seen by one of the office PAs, and was started on medications.  Unfortunately he apparently ran out of the medicines after 1 month.. During this period, partly because of the stress from work he was drinking quite heavily at nights and then was having bad days after.  Most days after drinking he would have episodes of significant left arm and chest pain. With continued symptoms, he try to get his medications refilled and his sister who is a PA helped him out by refilling his carvedilol and losartan.  He says that over the last 3 to 4 months he has significant cut down his alcohol use and has been doing pretty well but now about 6 weeks ago he started having again left-sided arm heaviness and pressure as well as chest pain.  He had them off and off about 3 from 3 to 5 days.  Oftentimes associated with elevated blood pressure.  When he finally got back on his carvedilol and losartan, his pressures did improve some.  Now, he notes having chest pain and dyspnea working in the backyard or doing things around the house.  He notes with dyspnea and chest pain/arm pain. Thankfully, no further syncopal episodes.  He has had intermittent headache but no real PND, orthopnea or edema.  No heart racing or palpitations.  He also notes that he never really feels very rested.  Always tired.  His wife tells him that he snores quite a bit.  CV Review of Symptoms  (Summary): positive for - chest pain, dyspnea on exertion, palpitations, rapid heart rate, shortness of breath, and fatigue, headaches and dizziness.  In addition to chest pain also having arm pain on the left side.  While he does not usually have rapid heart rates, he does note that sometimes after eating his heart rate will go up fast and his blood pressure will go up especially after eating something salty. negative for - rapid heart rate, shortness of breath, or syncope or near syncope, TIA/amaurosis fugax, claudication  REVIEWED  OF SYSTEMS   Review of Systems  Constitutional:  Positive for malaise/fatigue (Exercise intolerance.  Fatigue.). Negative for weight loss (had gained back weight that he had lost - (was down to 250 lb)).  HENT:  Negative for congestion and nosebleeds.   Respiratory:  Positive for shortness of breath.   Cardiovascular:  Positive for chest pain and palpitations. Negative for claudication.  Gastrointestinal:  Positive for nausea (Times has nausea and queasiness with chest pain but this has not happened since he stopped drinking heavily.). Negative for blood in stool and melena.  Genitourinary:  Negative for hematuria.  Musculoskeletal:  Positive for back pain and joint pain.  Skin: Negative.   Neurological:  Positive for dizziness and headaches. Negative for focal weakness and weakness.  Psychiatric/Behavioral:  Negative for depression and memory loss. The patient is nervous/anxious.     I have reviewed and (if needed) personally updated the patient's problem list, medications, allergies, past medical and surgical history, social and family history.   PAST MEDICAL HISTORY   Past Medical History:  Diagnosis Date   Dizziness    GERD (gastroesophageal reflux disease)    Hypercholesterolemia with hypertriglyceridemia 03/04/2012   Severely Elevated TG 1108   Morbid obesity with BMI of 40.0-44.9, adult ([facility])    Swelling     PAST SURGICAL HISTORY   Past Surgical  History:  Procedure Laterality Date   ADENOIDECTOMY     TONSILLECTOMY       There is no immunization history on file for this patient.   MEDICATIONS/ALLERGIES   Current Meds  Medication Sig   carvedilol (COREG) 25 MG tablet Take 25 mg by mouth 2 (two) times daily.   chlorthalidone (HYGROTON) 25 MG tablet Take 1 tablet (25 mg total) by mouth daily.   fenofibrate micronized (LOFIBRA) 134 MG capsule Take 1 capsule (134 mg total) by mouth daily before breakfast.   losartan (COZAAR) 50 MG tablet Take 50 mg by mouth 2 (two) times daily.   nitroGLYCERIN (NITROSTAT) 0.4 MG SL tablet Place 1 tablet (0.4 mg total) under the tongue every 5 (five) minutes as needed for chest pain.   rosuvastatin (CRESTOR) 10 MG tablet Take 10 mg by mouth at bedtime.   [DISCONTINUED] ASPIRIN 81 PO Take by mouth.   [DISCONTINUED] ibuprofen (ADVIL,MOTRIN) 200 MG tablet Take 200 mg by mouth every 6 (six) hours as needed. For pain.   -> just started on Crestor;     Allergies  Allergen Reactions   Doxycycline Rash    SOCIAL HISTORY/FAMILY HISTORY   Reviewed in Epic:   Social History   Tobacco Use   Smoking status: Former    Packs/day: 1.00    Types: Cigarettes   Smokeless tobacco: Current    Types: Chew  Substance Use Topics   Alcohol use: Yes    Comment: occ   Drug use: No   Social History   Social History Narrative      Former smoker -age [age]   Heavy EtOH use - until the last 4-5 months; over last year had been drinking heavily      His Sister is a PA - she helps with making sure he takes or uses medications.       Has a physically challenging job, but is not very active.    He has been doing well with weight loss, but over the last year has gotten "out of the habit of doing his exercise and eating right.  He gained back up from 250 pounds  to his current 327 pounds.   Family History  Problem Relation Age of Onset   Coronary artery disease Mother 66 - 42       was non-smoker &  otherwise health   Heart attack Mother 21 - 40       No diabetes   Sudden Cardiac Death Mother    Heart attack Father    Heart block Father    Atrial fibrillation Maternal Aunt 58       Apparently had complications and issues with her A-fib that eventually caused her death.   Heart attack Maternal Uncle 50 10-03-54   Sudden Cardiac Death Maternal Uncle 44 - 27    OBJCTIVE -PE, EKG, labs   Wt Readings from Last 3 Encounters:  03/17/22 (!) 327 lb 9.6 oz (148.6 kg)  09/28/11 218 lb (98.9 kg)   Physical Exam: BP (!) 146/100 (BP Location: Right Arm, Patient Position: Sitting, Cuff Size: Large)   Pulse 81   Ht 6' (1.829 m)   Wt (!) 327 lb 9.6 oz (148.6 kg)   SpO2 96%   BMI 44.43 kg/m  BP on the left arm was 132 / 102 mmHg. Physical Exam Constitutional:      General: He is not in acute distress.    Appearance: He is obese. He is not ill-appearing or toxic-appearing.     Comments: Morbidly obese.  Well-groomed.  HENT:     Head: Normocephalic and atraumatic.  Neck:     Thyroid: No thyroid mass or thyromegaly.     Vascular: No carotid bruit or JVD.  Cardiovascular:     Rate and Rhythm: Normal rate and regular rhythm. Occasional Extrasystoles are present.    Chest Wall: PMI is not displaced.     Pulses: Decreased pulses (Mildly decreased pedal pulses.).     Heart sounds: S1 normal and S2 normal. Heart sounds are distant. No murmur heard.    No friction rub. No S4 sounds.  Abdominal:     General: Abdomen is flat. There is no distension.     Tenderness: There is abdominal tenderness. There is no right CVA tenderness or guarding.  Musculoskeletal:        General: No swelling (Trivial).     Cervical back: Normal range of motion and neck supple.  Skin:    General: Skin is warm and dry.  Neurological:     General: No focal deficit present.     Mental Status: He is alert and oriented to person, place, and time.     Gait: Gait abnormal.  Psychiatric:        Mood and Affect: Mood  normal.        Thought Content: Thought content normal.     Comments: Somewhat anxious but appropriate      Adult ECG Report  Rate: 81;  Rhythm: normal sinus rhythm and normal axis, intervals and durations. ;   Narrative Interpretation: Normal  Recent Labs:   02/22/2022: TC 237, TG 1108, HDL 45, LDL 59.  Hgb 16.1, Cr 0.77, K+ 4.44.  TSH 1.17. No results found for: "CHOL", "HDL", "LDLCALC", "LDLDIRECT", "TRIG", "CHOLHDL" Lab Results  Component Value Date   CREATININE 0.82 03/17/2022   BUN 14 03/17/2022   NA 141 03/17/2022   K 4.3 03/17/2022   CL 103 03/17/2022   CO2 21 03/17/2022      Latest Ref Rng & Units 03/17/2022   10:44 AM 09/26/2011    3:05 PM  CBC  WBC 3.4 -  10.8 x10E3/uL 7.2  8.3   Hemoglobin 13.0 - 17.7 g/dL 16.1  16.3   Hematocrit 37.5 - 51.0 % 46.9  45.7   Platelets 150 - 450 x10E3/uL 150  275     No results found for: "HGBA1C" No results found for: "TSH"  ================================================== I spent a total of 48 minutes with the patient spent in direct patient consultation.  Additional time spent with chart review  / charting (studies, outside notes, etc): 54 min Total Time: 102 min  Current medicines are reviewed at length with the patient today.  (+/- concerns) n/a  Notice: This dictation was prepared with Dragon dictation along with smart phrase technology. Any transcriptional errors that result from this process are unintentional and may not be corrected upon review.   Studies Ordered:  Orders Placed This Encounter  Procedures   Basic metabolic panel   CBC   AMB Referral to [facility]   ECHOCARDIOGRAM COMPLETE   Meds ordered this encounter  Medications   fenofibrate micronized (LOFIBRA) 134 MG capsule    Sig: Take 1 capsule (134 mg total) by mouth daily before breakfast.    Dispense:  30 capsule    Refill:  11   chlorthalidone (HYGROTON) 25 MG tablet    Sig: Take 1 tablet (25 mg total) by mouth daily.     Dispense:  90 tablet    Refill:  3   nitroGLYCERIN (NITROSTAT) 0.4 MG SL tablet    Sig: Place 1 tablet (0.4 mg total) under the tongue every 5 (five) minutes as needed for chest pain.    Dispense:  25 tablet    Refill:  5   Shared Decision Making/Informed Consent The risks [stroke (1 in 1000), death (1 in 1000), kidney failure [usually temporary] (1 in 500), bleeding (1 in 200), allergic reaction [possibly serious] (1 in 200)], benefits (diagnostic support and management of coronary artery disease) and alternatives of a cardiac catheterization were discussed in detail with Mr. Doty and he is willing to proceed.   Patient Instructions / Medication Changes & Studies & Tests Ordered   Patient Instructions  Medication Instructions:   Start taking fenofibrate 134 mg     one tablet daily    Start taking chlorthalidone 25 mg one tablet daily   May use Nitroglycerin 0.4 mg - one tablet under your tongue  for chest discomfort - see instruction below.   *If you need a refill on your cardiac medications before your next appointment, please call your pharmacy*   Lab Work:  Cbc Bmp  Will do lipid in the hospital before your cardiac cath  If you have labs (blood work) drawn today and your tests are completely normal, you will receive your results only by: [place] (if you have MyChart) OR A paper copy in the mail If you have any lab test that is abnormal or we need to change your treatment, we will call you to review the results.   Testing/Procedures:  Will be schedule at [facility] has requested that you have an echocardiogram. Echocardiography is a painless test that uses sound waves to create images of your heart. It provides your doctor with information about the size and shape of your heart and how well your heart's chambers and valves are working. This procedure takes approximately one hour. There are no restrictions for this  procedure.    Follow-Up: At [facility], you and your health needs are our priority.  As part of our continuing mission to provide you with exceptional heart care, we have created designated Provider Care Teams.  These Care Teams include your primary Cardiologist (physician) and Advanced Practice Providers (APPs -  Physician Assistants and Nurse Practitioners) who all work together to provide you with the care you need, when you need it.  We recommend signing up for the patient portal called "MyChart".  Sign up information is provided on this After Visit Summary.  MyChart is used to connect with patients for Virtual Visits (Telemedicine).  Patients are able to view lab/test results, encounter notes, upcoming appointments, etc.  Non-urgent messages can be sent to your provider as well.   To learn more about what you can do with MyChart, go to [email or web address].    Your next appointment:   1 month(s)  The format for your next appointment:   In Person  Provider:   Glenetta Hew, MD    Other Instructions          Cardiac/Peripheral Catheterization   You are scheduled for a Cardiac Catheterization on Friday, October 13 with Dr. Glenetta Hew.  1. Please arrive at the Main Entrance A at [facility]: [place], [place] [address] on October 13 at 8:30 AM (This time is two hours before your procedure to ensure your preparation). Free valet parking service is available. You will check in at ADMITTING. The support person will be asked to wait in the waiting room.  It is OK to have someone drop you off and come back when you are ready to be discharged.        Special note: Every effort is made to have your procedure done on time. Please understand that emergencies sometimes delay scheduled procedures.   . 2. Diet: Do not eat solid foods after midnight.  You may have clear liquids until 5 AM the day of the procedure.  3. Labs: You will need to have blood drawn   CBC,BMP on Wednesday, October 11 at [place], [place]  Open: [place] (Lunch 12:30 - 1:30)   Phone: [phone]. You do not need to be fasting.  4. Medication instructions in preparation for your procedure:   Contrast Allergy: No   Stop taking, Cozaar (Losartan) Friday, October 13, only    On the morning of your procedure, take Aspirin 81 mg and any morning medicines NOT listed above.  You may use sips of water.  5. Plan to go home the same day, you will only stay overnight if medically necessary. 6. You MUST have a responsible adult to drive you home. 7. An adult MUST be with you the first 24 hours after you arrive home. 8. Bring a current list of your medications, and the last time and date medication taken. 9. Bring ID and current insurance cards. 10.Please wear clothes that are easy to get on and off and wear slip-on shoes.  Thank you for allowing [place] to care for you!   -- [facility]      Leonie Man, MD, MS Glenetta Hew, M.D., M.S. Interventional Cardiologist  [facility]  Pager # [phone] Phone # [phone] [address] [place], [place] [address]   Thank you for choosing [facility] [place]!!

## 2022-03-17 NOTE — Assessment & Plan Note (Signed)
Thankfully, he seems to successfully quit smoking several years ago.

## 2022-03-17 NOTE — Assessment & Plan Note (Signed)
Clearly his weight is playing a role in his exertional dyspnea as well as hypertension and hyperlipidemia.  We discussed dietary modifications and need for increased exercise, but with his ongoing chest pain symptoms, we need to exclude significant CAD first.  Once we can determine the presence or absence of coronary disease and treatment accordingly, would then recommend gradually increasing his exercise level as well as adopting a new diet.  Could very well benefit from dietary counseling.

## 2022-03-17 NOTE — Assessment & Plan Note (Signed)
Probably combination of deconditioning but could also be part of his anginal equivalent.  He is also obese and not very active with moderate smoking history.  Plan:    Check 2D Echo & Left Heart Cath- Coronary Angiography with possible Percutaneous Coronary Intervention

## 2022-03-17 NOTE — Progress Notes (Signed)
 Primary Care Provider: Default, Provider, MD; Polite, Ronald, MD [facility] Cardiologist: Criag Wicklund, MD Electrophysiologist: None  Clinic Note: Chief Complaint  Patient presents with   New Patient (Initial Visit)   Chest Pain   ===================================  ASSESSMENT/PLAN   Problem List Items Addressed This Visit       Cardiology Problems   Progressive angina (HCC) - Primary (Chronic)    He has atypical as well as typical features of his pain.  It happens both at rest and with exertion but definitely exacerbated by exertion.  He has a chest heaviness sensation but he also has left arm pain.  Somewhat concerning with all of his cardiac risk factors including severely elevated triglycerides and family history there is very prominent in his mother side for early CAD.  We talked about either direct cardiac catheterization versus coronary CT angiogram for ischemic evaluation.  Based on the progressive nature of symptoms, we decided with shared decision making that the best course of action would be to proceed directly with cardiac catheterization which will be scheduled for later this week.  See informed decision-making consent below.  Plan: Left Heart Catheterization with Coronary Angiography and Possible Percutaneous Coronary Intervention ->.  NTG as needed Already taking aspirin and low-dose beta-blocker.  Also high-dose beta-blocker.      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  He was just started on rosuvastatin 10 mg which he can continue, but he needs to be on a fibrate as well.  We will start fenofibrate high-dose and referred to Dr. Hilty advanced lipid clinic.  We will also recheck lipids along with LP(a) & HgbA1c as part of precath labs.      Relevant Medications   rosuvastatin (CRESTOR) 10 MG tablet   losartan (COZAAR) 50 MG tablet   carvedilol (COREG) 25 MG tablet   fenofibrate micronized (LOFIBRA) 134 MG capsule   chlorthalidone (HYGROTON) 25 MG tablet   nitroGLYCERIN (NITROSTAT) 0.4 MG SL tablet   Other Relevant Orders   Basic metabolic panel (Completed)   CBC (Completed)   ECHOCARDIOGRAM COMPLETE   AMB Referral to [facility] Disorders Clinic   Essential hypertension (Chronic)    Poorly controlled hypertension with ongoing symptoms.  Currently on current carvedilol 25 mg twice daily and losartan 50 mg twice daily  With diastolic pressures in the 90s to 100s, plan to initiate diuretic with chlorthalidone 25 mg daily.  Check baseline labs now which will also repeat precath labs, will then also recheck labs in about 2 weeks.      Relevant Medications   rosuvastatin (CRESTOR) 10 MG tablet   losartan (COZAAR) 50 MG tablet   carvedilol (COREG) 25 MG tablet   fenofibrate micronized (LOFIBRA) 134 MG capsule   chlorthalidone (HYGROTON) 25 MG tablet   nitroGLYCERIN (NITROSTAT) 0.4 MG SL tablet   Other Relevant Orders   Basic metabolic panel (Completed)     Other   DOE (dyspnea on exertion)    Probably combination of deconditioning but could also   be part of his anginal equivalent.  He is also obese and not very active with moderate smoking history.  Plan:   Check 2D Echo & Left Heart Cath- Coronary Angiography with possible Percutaneous Coronary Intervention      Relevant Orders   Basic metabolic panel (Completed)   CBC (Completed)   ECHOCARDIOGRAM COMPLETE   AMB Referral to Advanced Lipid Disorders Clinic    Family history of premature CAD (Chronic)    Pretty significant family history of CAD which may very well be related to familial hypertriglyceridemia.  We will also check lipid LP(a) as another risk factor.  Were planning ischemic evaluation with cardiac catheterization but also referral to lipid clinic to see Dr. 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      Relevant Orders   Basic metabolic panel (Completed)   CBC (Completed)   ECHOCARDIOGRAM COMPLETE   AMB Referral to [facility] Lipid Disorders Clinic   Morbid obesity with BMI of 40.0-44.9, adult (HCC)    Clearly his weight is playing a role in his exertional dyspnea as well as hypertension and hyperlipidemia.  We discussed dietary modifications and need for increased exercise, but with his ongoing chest pain symptoms, we need to exclude significant CAD first.  Once we can determine the presence or absence of coronary disease and treatment accordingly, would then recommend gradually increasing his exercise level as well as adopting a new diet.  Could very well benefit from dietary counseling.      Relevant Orders   Basic metabolic panel (Completed)   CBC (Completed)   ECHOCARDIOGRAM COMPLETE   AMB Referral to Advanced Lipid Disorders Clinic   ETOH abuse    Pretty significant alcohol use based on his report.  He has significantly cut down to the point where he is barely drinking now.  Since he has done that, his chest pain symptoms have improved a lot.  He had a few several near syncopal episodes about a year ago that can probably be attributed to being dehydrated and somewhat hung over from heavy night of drinking then going into a church which can be somewhat stuffy.  He felt the classic symptoms of of syncope which is probably related to dehydration and alcohol combination.      Relevant Orders   Basic metabolic panel (Completed)   CBC (Completed)   ECHOCARDIOGRAM COMPLETE   AMB Referral to Advanced  Lipid Disorders Clinic   Smoking (Chronic)    Thankfully, he seems to successfully quit smoking several years ago.      ===================================  HPI:    Jacob Carlson is a morbidly obese 40 y.o. male former smoker with a PMH notable for HTN, Hyperlipidemia/hypertriglyceridemia (Severe TG elevation), EtOH abuse as well as family history of CAD who is being seen today for the evaluation of CHEST PAIN/HEAVINESS at the request of Polite, Ronald, MD.  Hospitalizations:  09/26/2011 -> last ER visit with 3 to 5 hours of chest pain beginning 8/10 at the worst.  Felt like an aching cramp-like sensation with no radiation that began about an hour after eating.  He became lightheaded dizzy and diaphoretic.  Troponin was negative (0.02) EKG was relatively normal.  After initial evaluation was negative, but patient declined staying for Coronary CTA. Seen in follow-up by Dr. Nishan on 09/28/2011.  GXT ordered and started on losartan.  Plan was for follow-up in 3 months but he did not return.  Jacob Carlson was referred by Dr. Polite after being seen on   February 22, 2022 as a follow-up from urgent care evaluation with accelerated hypertension.  Blood pressure looks much better after initiation of BP meds.  Described recurrent chest pain/heaviness and arm discomfort occurring with exertion-walking up a hill.  Also occurs when eating. => Was started on aspirin and PPI (Nexium 40 mg).  Referred cardiology: Was on carvedilol 25mg twice daily and losartan 50 mg?  Twice daily    Reviewed  CV studies:    The following studies were reviewed today: (if available, images/films reviewed: From Epic Chart or Care Everywhere) ETT 10/22/2011: good exercise tolerance-exercised for 7:58 minutes-max HR 161 (88% MPHR-10.1 METS), no chest pain.  Normal BP/HR response.  No ST and T wave changes to suggest ischemia.  Interval History:   Jacob Carlson presents here today with complaints of chest pain and arm  pain. He said it all began about a year ago he had several episodes happening with his life.  Someone close to the mid diet and he went out the night for the funeral with his friends and have a drinking quite heavily.  When he got to the funeral he felt hot, flushed and nauseated with a headache and blurry vision.  His blood pressure at the time was 190/50 and he was not currently on medications at that time.  He then also had a couple other days where he felt pretty bad the day after drinking heavily with chest pain and hot flashes.  Apparently he had been seen by one of the office PAs, and was started on medications.  Unfortunately he apparently ran out of the medicines after 1 month.. During this period, partly because of the stress from work he was drinking quite heavily at nights and then was having bad days after.  Most days after drinking he would have episodes of significant left arm and chest pain. With continued symptoms, he try to get his medications refilled and his sister who is a PA helped him out by refilling his carvedilol and losartan.  He says that over the last 3 to 4 months he has significant cut down his alcohol use and has been doing pretty well but now about 6 weeks ago he started having again left-sided arm heaviness and pressure as well as chest pain.  He had them off and off about 3 from 3 to 5 days.  Oftentimes associated with elevated blood pressure.  When he finally got back on his carvedilol and losartan, his pressures did improve some.  Now, he notes having chest pain and dyspnea working in the backyard or doing things around the house.  He notes with dyspnea and chest pain/arm pain. Thankfully, no further syncopal episodes.  He has had intermittent headache but no real PND, orthopnea or edema.  No heart racing or palpitations.  He also notes that he never really feels very rested.  Always tired.  His wife tells him that he snores quite a bit.  CV Review of Symptoms  (Summary): positive for - chest pain, dyspnea on exertion, palpitations, rapid heart rate, shortness of breath, and fatigue, headaches and dizziness.  In addition to chest pain also having arm pain on the left side.  While he does not usually have rapid heart rates, he does note that sometimes after eating his heart rate will go up fast and his blood pressure will go up especially after eating something salty. negative for - rapid heart rate, shortness of breath, or syncope or near syncope, TIA/amaurosis fugax, claudication  REVIEWED   OF SYSTEMS   Review of Systems  Constitutional:  Positive for malaise/fatigue (Exercise intolerance.  Fatigue.). Negative for weight loss (had gained back weight that he had lost - (was down to 250 lb)).  HENT:  Negative for congestion and nosebleeds.   Respiratory:  Positive for shortness of breath.   Cardiovascular:  Positive for chest pain and palpitations. Negative for claudication.  Gastrointestinal:  Positive for nausea (Times has nausea and queasiness with chest pain but this has not happened since he stopped drinking heavily.). Negative for blood in stool and melena.  Genitourinary:  Negative for hematuria.  Musculoskeletal:  Positive for back pain and joint pain.  Skin: Negative.   Neurological:  Positive for dizziness and headaches. Negative for focal weakness and weakness.  Psychiatric/Behavioral:  Negative for depression and memory loss. The patient is nervous/anxious.     I have reviewed and (if needed) personally updated the patient's problem list, medications, allergies, past medical and surgical history, social and family history.   PAST MEDICAL HISTORY   Past Medical History:  Diagnosis Date   Dizziness    GERD (gastroesophageal reflux disease)    Hypercholesterolemia with hypertriglyceridemia 03/04/2012   Severely Elevated TG 1108   Morbid obesity with BMI of 40.0-44.9, adult (HCC)    Swelling     PAST SURGICAL HISTORY   Past Surgical  History:  Procedure Laterality Date   ADENOIDECTOMY     TONSILLECTOMY       There is no immunization history on file for this patient.   MEDICATIONS/ALLERGIES   Current Meds  Medication Sig   carvedilol (COREG) 25 MG tablet Take 25 mg by mouth 2 (two) times daily.   chlorthalidone (HYGROTON) 25 MG tablet Take 1 tablet (25 mg total) by mouth daily.   fenofibrate micronized (LOFIBRA) 134 MG capsule Take 1 capsule (134 mg total) by mouth daily before breakfast.   losartan (COZAAR) 50 MG tablet Take 50 mg by mouth 2 (two) times daily.   nitroGLYCERIN (NITROSTAT) 0.4 MG SL tablet Place 1 tablet (0.4 mg total) under the tongue every 5 (five) minutes as needed for chest pain.   rosuvastatin (CRESTOR) 10 MG tablet Take 10 mg by mouth at bedtime.   [DISCONTINUED] ASPIRIN 81 PO Take by mouth.   [DISCONTINUED] ibuprofen (ADVIL,MOTRIN) 200 MG tablet Take 200 mg by mouth every 6 (six) hours as needed. For pain.   -> just started on Crestor;     Allergies  Allergen Reactions   Doxycycline Rash    SOCIAL HISTORY/FAMILY HISTORY   Reviewed in Epic:   Social History   Tobacco Use   Smoking status: Former    Packs/day: 1.00    Types: Cigarettes   Smokeless tobacco: Current    Types: Chew  Substance Use Topics   Alcohol use: Yes    Comment: occ   Drug use: No   Social History   Social History Narrative      Former smoker -age [age]   Heavy EtOH use - until the last 4-5 months; over last year had been drinking heavily      His Sister is a PA - she helps with making sure he takes or uses medications.       Has a physically challenging job, but is not very active.    He has been doing well with weight loss, but over the last year has gotten "out of the habit of doing his exercise and eating right.  He gained back up from 250 pounds   to his current 327 pounds.   Family History  Problem Relation Age of Onset   Coronary artery disease Mother 55 - 58       was non-smoker &  otherwise health   Heart attack Mother 55 - 58       No diabetes   Sudden Cardiac Death Mother    Heart attack Father    Heart block Father    Atrial fibrillation Maternal Aunt 58       Apparently had complications and issues with her A-fib that eventually caused her death.   Heart attack Maternal Uncle 50 - 56   Sudden Cardiac Death Maternal Uncle 50 - 56    OBJCTIVE -PE, EKG, labs   Wt Readings from Last 3 Encounters:  03/17/22 (!) 327 lb 9.6 oz (148.6 kg)  09/28/11 218 lb (98.9 kg)   Physical Exam: BP (!) 146/100 (BP Location: Right Arm, Patient Position: Sitting, Cuff Size: Large)   Pulse 81   Ht 6' (1.829 m)   Wt (!) 327 lb 9.6 oz (148.6 kg)   SpO2 96%   BMI 44.43 kg/m  BP on the left arm was 132 / 102 mmHg. Physical Exam Constitutional:      General: He is not in acute distress.    Appearance: He is obese. He is not ill-appearing or toxic-appearing.     Comments: Morbidly obese.  Well-groomed.  HENT:     Head: Normocephalic and atraumatic.  Neck:     Thyroid: No thyroid mass or thyromegaly.     Vascular: No carotid bruit or JVD.  Cardiovascular:     Rate and Rhythm: Normal rate and regular rhythm. Occasional Extrasystoles are present.    Chest Wall: PMI is not displaced.     Pulses: Decreased pulses (Mildly decreased pedal pulses.).     Heart sounds: S1 normal and S2 normal. Heart sounds are distant. No murmur heard.    No friction rub. No S4 sounds.  Abdominal:     General: Abdomen is flat. There is no distension.     Tenderness: There is abdominal tenderness. There is no right CVA tenderness or guarding.  Musculoskeletal:        General: No swelling (Trivial).     Cervical back: Normal range of motion and neck supple.  Skin:    General: Skin is warm and dry.  Neurological:     General: No focal deficit present.     Mental Status: He is alert and oriented to person, place, and time.     Gait: Gait abnormal.  Psychiatric:        Mood and Affect: Mood  normal.        Thought Content: Thought content normal.     Comments: Somewhat anxious but appropriate      Adult ECG Report  Rate: 81;  Rhythm: normal sinus rhythm and normal axis, intervals and durations. ;   Narrative Interpretation: Normal  Recent Labs:   02/22/2022: TC 237, TG 1108, HDL 45, LDL 59.  Hgb 16.1, Cr 0.77, K+ 4.44.  TSH 1.17. No results found for: "CHOL", "HDL", "LDLCALC", "LDLDIRECT", "TRIG", "CHOLHDL" Lab Results  Component Value Date   CREATININE 0.82 03/17/2022   BUN 14 03/17/2022   NA 141 03/17/2022   K 4.3 03/17/2022   CL 103 03/17/2022   CO2 21 03/17/2022      Latest Ref Rng & Units 03/17/2022   10:44 AM 09/26/2011    3:05 PM  CBC  WBC 3.4 -   10.8 x10E3/uL 7.2  8.3   Hemoglobin 13.0 - 17.7 g/dL 16.1  16.3   Hematocrit 37.5 - 51.0 % 46.9  45.7   Platelets 150 - 450 x10E3/uL 150  275     No results found for: "HGBA1C" No results found for: "TSH"  ================================================== I spent a total of 48 minutes with the patient spent in direct patient consultation.  Additional time spent with chart review  / charting (studies, outside notes, etc): 54 min Total Time: 102 min  Current medicines are reviewed at length with the patient today.  (+/- concerns) n/a  Notice: This dictation was prepared with Dragon dictation along with smart phrase technology. Any transcriptional errors that result from this process are unintentional and may not be corrected upon review.   Studies Ordered:  Orders Placed This Encounter  Procedures   Basic metabolic panel   CBC   AMB Referral to Advanced Lipid Disorders Clinic   ECHOCARDIOGRAM COMPLETE   Meds ordered this encounter  Medications   fenofibrate micronized (LOFIBRA) 134 MG capsule    Sig: Take 1 capsule (134 mg total) by mouth daily before breakfast.    Dispense:  30 capsule    Refill:  11   chlorthalidone (HYGROTON) 25 MG tablet    Sig: Take 1 tablet (25 mg total) by mouth daily.     Dispense:  90 tablet    Refill:  3   nitroGLYCERIN (NITROSTAT) 0.4 MG SL tablet    Sig: Place 1 tablet (0.4 mg total) under the tongue every 5 (five) minutes as needed for chest pain.    Dispense:  25 tablet    Refill:  5   Shared Decision Making/Informed Consent The risks [stroke (1 in 1000), death (1 in 1000), kidney failure [usually temporary] (1 in 500), bleeding (1 in 200), allergic reaction [possibly serious] (1 in 200)], benefits (diagnostic support and management of coronary artery disease) and alternatives of a cardiac catheterization were discussed in detail with Mr. Doty and he is willing to proceed.   Patient Instructions / Medication Changes & Studies & Tests Ordered   Patient Instructions  Medication Instructions:   Start taking fenofibrate 134 mg     one tablet daily    Start taking chlorthalidone 25 mg one tablet daily   May use Nitroglycerin 0.4 mg - one tablet under your tongue  for chest discomfort - see instruction below.   *If you need a refill on your cardiac medications before your next appointment, please call your pharmacy*   Lab Work:  Cbc Bmp  Will do lipid in the hospital before your cardiac cath  If you have labs (blood work) drawn today and your tests are completely normal, you will receive your results only by: [place] (if you have MyChart) OR A paper copy in the mail If you have any lab test that is abnormal or we need to change your treatment, we will call you to review the results.   Testing/Procedures:  Will be schedule at [place] has requested that you have an echocardiogram. Echocardiography is a painless test that uses sound waves to create images of your heart. It provides your doctor with information about the size and shape of your heart and how well your heart's chambers and valves are working. This procedure takes approximately one hour. There are no restrictions for this  procedure.    Follow-Up: At [facility], you and your health needs are our priority.  As part of our continuing mission to provide you with exceptional heart care, we have created designated Provider Care Teams.  These Care Teams include your primary Cardiologist (physician) and Advanced Practice Providers (APPs -  Physician Assistants and Nurse Practitioners) who all work together to provide you with the care you need, when you need it.  We recommend signing up for the patient portal called "MyChart".  Sign up information is provided on this After Visit Summary.  MyChart is used to connect with patients for Virtual Visits (Telemedicine).  Patients are able to view lab/test results, encounter notes, upcoming appointments, etc.  Non-urgent messages can be sent to your provider as well.   To learn more about what you can do with MyChart, go to [email or web address].    Your next appointment:   1 month(s)  The format for your next appointment:   In Person  Provider:   Jessilynn Taft, MD    Other Instructions          Cardiac/Peripheral Catheterization   You are scheduled for a Cardiac Catheterization on Friday, October 13 with Dr. Delma Villalva.  1. Please arrive at the Main Entrance A at [facility]: [address] [place], [place] [address] on October 13 at 8:30 AM (This time is two hours before your procedure to ensure your preparation). Free valet parking service is available. You will check in at ADMITTING. The support person will be asked to wait in the waiting room.  It is OK to have someone drop you off and come back when you are ready to be discharged.        Special note: Every effort is made to have your procedure done on time. Please understand that emergencies sometimes delay scheduled procedures.   . 2. Diet: Do not eat solid foods after midnight.  You may have clear liquids until 5 AM the day of the procedure.  3. Labs: You will need to have blood drawn   CBC,BMP on Wednesday, October 11 at [facility] [address], [place]  Open: 8am - 5pm (Lunch 12:30 - 1:30)   Phone: [phone]. You do not need to be fasting.  4. Medication instructions in preparation for your procedure:   Contrast Allergy: No   Stop taking, Cozaar (Losartan) Friday, October 13, only    On the morning of your procedure, take Aspirin 81 mg and any morning medicines NOT listed above.  You may use sips of water.  5. Plan to go home the same day, you will only stay overnight if medically necessary. 6. You MUST have a responsible adult to drive you home. 7. An adult MUST be with you the first 24 hours after you arrive home. 8. Bring a current list of your medications, and the last time and date medication taken. 9. Bring ID and current insurance cards. 10.Please wear clothes that are easy to get on and off and wear slip-on shoes.  Thank you for allowing us to care for you!   -- [facility]      Denny Lave W. Lesly Joslyn, MD, MS Leyton Brownlee, M.D., M.S. Interventional Cardiologist  [facility]  Pager # [phone] Phone # [phone] [address] [place], [place] [address]   Thank you for choosing [facility]!!     

## 2022-03-17 NOTE — Assessment & Plan Note (Addendum)
He has atypical as well as typical features of his pain.  It happens both at rest and with exertion but definitely exacerbated by exertion.  He has a chest heaviness sensation but he also has left arm pain.  Somewhat concerning with all of his cardiac risk factors including severely elevated triglycerides and family history there is very prominent in his mother side for early CAD.  We talked about either direct cardiac catheterization versus coronary CT angiogram for ischemic evaluation.  Based on the progressive nature of symptoms, we decided with shared decision making that the best course of action would be to proceed directly with cardiac catheterization which will be scheduled for later this week.  See informed decision-making consent below.  Plan: Left Heart Catheterization with Coronary Angiography and Possible Percutaneous Coronary Intervention ->.  NTG as needed Already taking aspirin and low-dose beta-blocker.  Also high-dose beta-blocker.

## 2022-03-17 NOTE — Assessment & Plan Note (Addendum)
Very concerning lipids with triglycerides over thousand.  He was just started on rosuvastatin 10 mg which he can continue, but he needs to be on a fibrate as well.  We will start fenofibrate high-dose and referred to Dr. Debara Pickett advanced lipid clinic.  We will also recheck lipids along with LP(a) & HgbA1c as part of precath labs.

## 2022-03-17 NOTE — Assessment & Plan Note (Signed)
Pretty significant alcohol use based on his report.  He has significantly cut down to the point where he is barely drinking now.  Since he has done that, his chest pain symptoms have improved a lot.  He had a few several near syncopal episodes about a year ago that can probably be attributed to being dehydrated and somewhat hung over from heavy night of drinking then going into a church which can be somewhat stuffy.  He felt the classic symptoms of of syncope which is probably related to dehydration and alcohol combination.

## 2022-03-18 ENCOUNTER — Telehealth: Payer: Self-pay | Admitting: *Deleted

## 2022-03-18 NOTE — Addendum Note (Signed)
Addended by: Linton Ham on: 03/18/2022 10:35 AM   Modules accepted: Orders

## 2022-03-18 NOTE — Telephone Encounter (Signed)
Cardiac Catheterization scheduled at [facility] for: Friday March 19, 2022 10:30 AM Arrival time and place: [place] Entrance A at: 8:30 AM  Nothing to eat after midnight prior to procedure, clear liquids until 5 AM day of procedure.  Medication instructions: -Hold:  Chlorthalidone-AM of procedure -Except hold medications usual morning medications can be taken with sips of water including aspirin 81 mg.  Confirmed patient has responsible adult to drive home post procedure and be with patient first 24 hours after arriving home.  Patient reports no new symptoms concerning for COVID-19 in the past 10 days.  Reviewed procedure instructions with patient.

## 2022-03-19 ENCOUNTER — Ambulatory Visit (HOSPITAL_COMMUNITY)
Admission: RE | Disposition: A | Discharge status: Home / Self Care | Payer: Self-pay | Source: Ambulatory Visit | Attending: Cardiology

## 2022-03-19 ENCOUNTER — Other Ambulatory Visit: Payer: Self-pay

## 2022-03-19 ENCOUNTER — Other Ambulatory Visit (HOSPITAL_COMMUNITY): Payer: Self-pay

## 2022-03-19 ENCOUNTER — Ambulatory Visit (HOSPITAL_COMMUNITY)
Admission: RE | Admit: 2022-03-19 | Discharge: 2022-03-19 | Disposition: A | Discharge status: Home / Self Care | Payer: 59 | Source: Ambulatory Visit | Attending: Cardiology | Admitting: Cardiology

## 2022-03-19 DIAGNOSIS — Z955 Presence of coronary angioplasty implant and graft: Secondary | ICD-10-CM | POA: Insufficient documentation

## 2022-03-19 DIAGNOSIS — I25110 Atherosclerotic heart disease of native coronary artery with unstable angina pectoris: Secondary | ICD-10-CM

## 2022-03-19 DIAGNOSIS — I200 Unstable angina: Secondary | ICD-10-CM

## 2022-03-19 DIAGNOSIS — Z8249 Family history of ischemic heart disease and other diseases of the circulatory system: Secondary | ICD-10-CM | POA: Diagnosis not present

## 2022-03-19 DIAGNOSIS — Z87891 Personal history of nicotine dependence: Secondary | ICD-10-CM | POA: Diagnosis not present

## 2022-03-19 DIAGNOSIS — R0609 Other forms of dyspnea: Secondary | ICD-10-CM

## 2022-03-19 DIAGNOSIS — Z6841 Body Mass Index (BMI) 40.0 and over, adult: Secondary | ICD-10-CM | POA: Insufficient documentation

## 2022-03-19 DIAGNOSIS — R0600 Dyspnea, unspecified: Secondary | ICD-10-CM | POA: Insufficient documentation

## 2022-03-19 DIAGNOSIS — R079 Chest pain, unspecified: Secondary | ICD-10-CM

## 2022-03-19 DIAGNOSIS — F1010 Alcohol abuse, uncomplicated: Secondary | ICD-10-CM | POA: Diagnosis not present

## 2022-03-19 DIAGNOSIS — E782 Mixed hyperlipidemia: Secondary | ICD-10-CM | POA: Diagnosis present

## 2022-03-19 DIAGNOSIS — I10 Essential (primary) hypertension: Secondary | ICD-10-CM | POA: Insufficient documentation

## 2022-03-19 HISTORY — PX: CORONARY STENT INTERVENTION: CATH118234

## 2022-03-19 HISTORY — PX: LEFT HEART CATH AND CORONARY ANGIOGRAPHY: CATH118249

## 2022-03-19 LAB — LIPID PANEL
Cholesterol: 147 mg/dL (ref 0–200)
HDL: 23 mg/dL — ABNORMAL LOW (ref >40)
LDL Cholesterol: 49 mg/dL (ref 0–99)
Total CHOL/HDL Ratio: 6.4 RATIO
Triglycerides: 373 mg/dL — ABNORMAL HIGH (ref <150)
VLDL: 75 mg/dL — ABNORMAL HIGH (ref 0–40)

## 2022-03-19 LAB — POCT ACTIVATED CLOTTING TIME: Activated Clotting Time: 323 seconds

## 2022-03-19 SURGERY — LEFT HEART CATH AND CORONARY ANGIOGRAPHY
Anesthesia: LOCAL

## 2022-03-19 MED ORDER — MORPHINE SULFATE (PF) 2 MG/ML IV SOLN: 2.0000 mg | INTRAVENOUS | Status: DC | PRN

## 2022-03-19 MED ORDER — SODIUM CHLORIDE 0.9% FLUSH: 3.0000 mL | INTRAVENOUS | Status: DC | PRN

## 2022-03-19 MED ORDER — SODIUM CHLORIDE 0.9 % IV SOLN: 250.0000 mL | INTRAVENOUS | Status: DC | PRN

## 2022-03-19 MED ORDER — HEPARIN SODIUM (PORCINE) 1000 UNIT/ML IJ SOLN
INTRAMUSCULAR | Status: AC
  Filled 2022-03-19: qty 10

## 2022-03-19 MED ORDER — VERAPAMIL HCL 2.5 MG/ML IV SOLN
INTRAVENOUS | Status: AC
  Filled 2022-03-19: qty 2

## 2022-03-19 MED ORDER — HEPARIN SODIUM (PORCINE) 1000 UNIT/ML IJ SOLN
INTRAMUSCULAR | Status: DC | PRN
  Administered 2022-03-19: 6000 [IU] via INTRAVENOUS
  Administered 2022-03-19: 7000 [IU] via INTRAVENOUS

## 2022-03-19 MED ORDER — LABETALOL HCL 5 MG/ML IV SOLN: 10.0000 mg | INTRAVENOUS | Status: DC | PRN

## 2022-03-19 MED ORDER — SODIUM CHLORIDE 0.9 % WEIGHT BASED INFUSION: 1.0000 mL/kg/h | INTRAVENOUS | Status: DC

## 2022-03-19 MED ORDER — LIDOCAINE HCL (PF) 1 % IJ SOLN
INTRAMUSCULAR | Status: AC
  Filled 2022-03-19: qty 30

## 2022-03-19 MED ORDER — CLOPIDOGREL BISULFATE 75 MG PO TABS
ORAL_TABLET | ORAL | Status: AC
  Filled 2022-03-19: qty 8

## 2022-03-19 MED ORDER — SODIUM CHLORIDE 0.9% FLUSH
3.0000 mL | Freq: Two times a day (BID) | INTRAVENOUS | Status: DC
  Administered 2022-03-19: 3 mL via INTRAVENOUS

## 2022-03-19 MED ORDER — NITROGLYCERIN 1 MG/10 ML FOR IR/CATH LAB
INTRA_ARTERIAL | Status: DC | PRN
  Administered 2022-03-19: 200 ug via INTRACORONARY

## 2022-03-19 MED ORDER — PANTOPRAZOLE SODIUM 40 MG PO TBEC
40.0000 mg | DELAYED_RELEASE_TABLET | Freq: Every day | ORAL | 1 refills | Status: DC
  Filled 2022-03-19 – 2022-04-17 (×2): qty 30, 30d supply, fill #0
  Filled 2022-04-17: qty 30, 30d supply, fill #1

## 2022-03-19 MED ORDER — MIDAZOLAM HCL 2 MG/2ML IJ SOLN
INTRAMUSCULAR | Status: AC
  Filled 2022-03-19: qty 2

## 2022-03-19 MED ORDER — SODIUM CHLORIDE 0.9 % WEIGHT BASED INFUSION
3.0000 mL/kg/h | INTRAVENOUS | Status: AC
  Administered 2022-03-19: 3 mL/kg/h via INTRAVENOUS

## 2022-03-19 MED ORDER — SODIUM CHLORIDE 0.9% FLUSH
3.0000 mL | INTRAVENOUS | Status: DC | PRN
  Administered 2022-03-19: 3 mL via INTRAVENOUS

## 2022-03-19 MED ORDER — LIDOCAINE HCL (PF) 1 % IJ SOLN
INTRAMUSCULAR | Status: DC | PRN
  Administered 2022-03-19: 2 mL

## 2022-03-19 MED ORDER — FENTANYL CITRATE (PF) 100 MCG/2ML IJ SOLN
INTRAMUSCULAR | Status: AC
  Filled 2022-03-19: qty 2

## 2022-03-19 MED ORDER — ASPIRIN 81 MG PO TBEC
81.0000 mg | DELAYED_RELEASE_TABLET | Freq: Every day | ORAL | 1 refills | Status: DC
  Filled 2022-03-19: qty 90, 90d supply, fill #0
  Filled 2022-06-12: qty 90, 90d supply, fill #1

## 2022-03-19 MED ORDER — HEPARIN (PORCINE) IN NACL 1000-0.9 UT/500ML-% IV SOLN
INTRAVENOUS | Status: AC
  Filled 2022-03-19: qty 1000

## 2022-03-19 MED ORDER — ONDANSETRON HCL 4 MG/2ML IJ SOLN: 4.0000 mg | Freq: Four times a day (QID) | INTRAMUSCULAR | Status: DC | PRN

## 2022-03-19 MED ORDER — CLOPIDOGREL BISULFATE 75 MG PO TABS
ORAL_TABLET | ORAL | Status: DC | PRN
  Administered 2022-03-19: 600 mg via ORAL

## 2022-03-19 MED ORDER — FENTANYL CITRATE (PF) 100 MCG/2ML IJ SOLN
INTRAMUSCULAR | Status: DC | PRN
  Administered 2022-03-19 (×2): 25 ug via INTRAVENOUS

## 2022-03-19 MED ORDER — SODIUM CHLORIDE 0.9% FLUSH: 3.0000 mL | Freq: Two times a day (BID) | INTRAVENOUS | Status: DC

## 2022-03-19 MED ORDER — HYDRALAZINE HCL 20 MG/ML IJ SOLN: 10.0000 mg | INTRAMUSCULAR | Status: DC | PRN

## 2022-03-19 MED ORDER — IOHEXOL 350 MG/ML SOLN
INTRAVENOUS | Status: DC | PRN
  Administered 2022-03-19: 130 mL

## 2022-03-19 MED ORDER — NITROGLYCERIN 1 MG/10 ML FOR IR/CATH LAB
INTRA_ARTERIAL | Status: AC
  Filled 2022-03-19: qty 10

## 2022-03-19 MED ORDER — CLOPIDOGREL BISULFATE 75 MG PO TABS
75.0000 mg | ORAL_TABLET | Freq: Every day | ORAL | 11 refills | Status: DC
  Filled 2022-03-19: qty 30, 30d supply, fill #0

## 2022-03-19 MED ORDER — ACETAMINOPHEN 325 MG PO TABS: 650.0000 mg | ORAL_TABLET | ORAL | Status: DC | PRN

## 2022-03-19 MED ORDER — MIDAZOLAM HCL 2 MG/2ML IJ SOLN
INTRAMUSCULAR | Status: DC | PRN
  Administered 2022-03-19: 1 mg via INTRAVENOUS
  Administered 2022-03-19: 2 mg via INTRAVENOUS

## 2022-03-19 MED ORDER — CLOPIDOGREL BISULFATE 75 MG PO TABS: 75.0000 mg | ORAL_TABLET | Freq: Every day | ORAL | 0 refills | Status: DC

## 2022-03-19 MED ORDER — CLOPIDOGREL BISULFATE 75 MG PO TABS: 75.0000 mg | ORAL_TABLET | Freq: Every day | ORAL | Status: DC

## 2022-03-19 MED ORDER — VERAPAMIL HCL 2.5 MG/ML IV SOLN
INTRAVENOUS | Status: DC | PRN
  Administered 2022-03-19: 10 mL via INTRA_ARTERIAL

## 2022-03-19 SURGICAL SUPPLY — 21 items
BALL SAPPHIRE NC24 4.0X12 (BALLOONS) ×1
BALLN SAPPHIRE 3.5X15 (BALLOONS) ×1
BALLOON SAPPHIRE 3.5X15 (BALLOONS) IMPLANT
BALLOON SAPPHIRE NC24 4.0X12 (BALLOONS) IMPLANT
CATH INFINITI 5 FR JL3.5 (CATHETERS) IMPLANT
CATH LAUNCHER 6FR JR4 (CATHETERS) IMPLANT
CATH OPTITORQUE TIG 4.0 5F (CATHETERS) IMPLANT
DEVICE RAD COMP TR BAND LRG (VASCULAR PRODUCTS) IMPLANT
ELECT DEFIB PAD ADLT CADENCE (PAD) IMPLANT
GLIDESHEATH SLEND SS 6F .021 (SHEATH) IMPLANT
GUIDEWIRE INQWIRE 1.5J.035X260 (WIRE) IMPLANT
INQWIRE 1.5J .035X260CM (WIRE) ×2
KIT ENCORE 26 ADVANTAGE (KITS) IMPLANT
KIT HEART LEFT (KITS) ×1 IMPLANT
PACK CARDIAC CATHETERIZATION (CUSTOM PROCEDURE TRAY) ×1 IMPLANT
SHEATH PROBE COVER 6X72 (BAG) IMPLANT
STENT SYNERGY XD 4.0X16 (Permanent Stent) IMPLANT
SYNERGY XD 4.0X16 (Permanent Stent) ×1 IMPLANT
TRANSDUCER W/STOPCOCK (MISCELLANEOUS) ×1 IMPLANT
TUBING CIL FLEX 10 FLL-RA (TUBING) ×1 IMPLANT
WIRE ASAHI PROWATER 180CM (WIRE) IMPLANT

## 2022-03-19 NOTE — Discharge Summary (Signed)
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 ->  Ulcerated eccentric plaque.   A drug-eluting stent was successfully placed using a SYNERGY XD 4.0X16 -> postdilated to 4.2 mm   Post intervention, there is a 0% residual stenosis.   Dist LAD lesion is 50% stenosed.  NTG responsive spasm.   The left ventricular systolic function is normal. The left ventricular ejection fraction is 55-65% by visual estimate.   LV end diastolic pressure is normal.   There is no aortic valve stenosis.   POST-OP DIAGNOSES Two-vessel CAD with culprit lesion being a focal eccentric ulcerated plaque shelf in the proximal RCA (70%), and distal LAD segmental likely intramyocardial segment of 50% stenosis with NTG responsive spasm). Successful DES PCI of proximal RCA lesion with Synergy DES 4.0 mm x 16 mm postdilated to 4.2 mm. Lesion reduced to 0%, TIMI-3 flow pre and post. Poor imaging, but appears to be relatively normal LV size and function.  Normal EDP.     PLAN Same-day discharge DAPT for 6 months followed by additional 6 months of monotherapy with clopidogrel. Continue with statin (okay at low-dose) and fibrate-labs this morning look much better (has referral placed to Dr. Debara Pickett). Further adjustments to medications in the outpatient setting.  Consider calcium channel blocker for antispasm effect.   Glenetta Hew  Diagnostic Dominance: Right  Intervention   _____________    History of Present Illness     Jacob Carlson is a 41 y.o. male with of HTN, HLD/hypertriglyceridemia, ETOH use along with family hx of CAD who was sent as a referral to Dr. Ellyn Hack from his PCP. He presented to office with complaints of chest pain and arm pain. Reported having episodes over the past year. Given his hx and symptoms options were discussed and patient elected to proceed with outpatient cardiac cath.   Hospital Course     The patient underwent cardiac cath as noted above with two vessel CAD, culprit lesion felt to be focal ulcerated plaque in the pRCA and dLAD with segmental likely intramyocardial segment of 50% stenosis with nitro responsive spasm.  PCI/DES x1 to pRCA. Plan for DAPT with ASA/plavix for at least 6 months. The patient was seen by cardiac rehab while in short stay. There were no observed complications post cath. Radial cath site was re-evaluated prior to discharge and found to be stable without any complications. Instructions/precautions regarding cath site care were given prior to discharge.  [name] was seen by Dr. Ellyn Hack and determined stable for discharge home. Follow up with our office has been arranged. Medications are listed below. Pertinent changes include addition of plavix.  _____________  Cath/PCI Registry Performance & Quality Measures: Aspirin prescribed? - Yes ADP Receptor Inhibitor (Plavix/Clopidogrel, Brilinta/Ticagrelor or Effient/Prasugrel) prescribed (includes medically managed patients)? - Yes High Intensity Statin (Lipitor 40-80mg  or Crestor 20-40mg ) prescribed? - No - per MD plan to continue at low dose statin for now For EF <40%, was ACEI/ARB prescribed? - Not Applicable (EF >/= 56%) For EF <40%, Aldosterone Antagonist (  Spironolactone or Eplerenone) prescribed? - Not Applicable (EF >/= 95%) Cardiac Rehab Phase II ordered (Included Medically managed Patients)? - Yes  _____________   Discharge Vitals Blood pressure 121/80, pulse 90,  temperature 98.1 F (36.7 C), temperature source Oral, resp. rate (!) 23, height 6' (1.829 m), weight (!) 145.2 kg, SpO2 97 %.  Filed Weights   03/19/22 0858  Weight: (!) 145.2 kg    Last Labs & Radiologic Studies    CBC Recent Labs    03/17/22 1044  WBC 7.2  HGB 16.1  HCT 46.9  MCV 86  PLT 188   Basic Metabolic Panel Recent Labs    03/17/22 1044  NA 141  K 4.3  CL 103  CO2 21  GLUCOSE 101*  BUN 14  CREATININE 0.82  CALCIUM 9.6   Liver Function Tests No results for input(s): "AST", "ALT", "ALKPHOS", "BILITOT", "PROT", "ALBUMIN" in the last 72 hours. No results for input(s): "LIPASE", "AMYLASE" in the last 72 hours. High Sensitivity Troponin:   No results for input(s): "TROPONINIHS" in the last 720 hours.  BNP Invalid input(s): "POCBNP" D-Dimer No results for input(s): "DDIMER" in the last 72 hours. Hemoglobin A1C No results for input(s): "HGBA1C" in the last 72 hours. Fasting Lipid Panel Recent Labs    03/19/22 0841  CHOL 147  HDL 23*  LDLCALC 49  TRIG 373*  CHOLHDL 6.4   Thyroid Function Tests No results for input(s): "TSH", "T4TOTAL", "T3FREE", "THYROIDAB" in the last 72 hours.  Invalid input(s): "FREET3" _____________  CARDIAC CATHETERIZATION  Result Date: 03/19/2022   Prox RCA lesion is 75% stenosed. ->  Ulcerated eccentric plaque.   A drug-eluting stent was successfully placed using a SYNERGY XD 4.0X16 -> postdilated to 4.2 mm   Post intervention, there is a 0% residual stenosis.   Dist LAD lesion is 50% stenosed.  NTG responsive spasm.   The left ventricular systolic function is normal. The left ventricular ejection fraction is 55-65% by visual estimate.   LV end diastolic pressure is normal.   There is no aortic valve stenosis. POST-OP DIAGNOSES Two-vessel CAD with culprit lesion being a focal eccentric ulcerated plaque shelf in the proximal RCA (70%), and distal LAD segmental likely intramyocardial segment of 50% stenosis with NTG responsive  spasm). Successful DES PCI of proximal RCA lesion with Synergy DES 4.0 mm x 16 mm postdilated to 4.2 mm. Lesion reduced to 0%, TIMI-3 flow pre and post. Poor imaging, but appears to be relatively normal LV size and function.  Normal EDP. PLAN Same-day discharge DAPT for 6 months followed by additional 6 months of monotherapy with clopidogrel. Continue with statin (okay at low-dose) and fibrate-labs this morning look much better (has referral placed to Dr. Debara Pickett). Further adjustments to medications in the outpatient setting.  Consider calcium channel blocker for antispasm effect. Glenetta Hew   Disposition   Pt is being discharged home today in good condition.  Follow-up Plans & Appointments     Follow-up Information     Leonie Man, MD Follow up on 04/06/2022.   Specialty: Cardiology Why: at 9:30am for your follow up appt Contact information: [address] [place] [address] [phone]                Discharge Instructions     Amb Referral to Cardiac Rehabilitation   Complete by: As directed    Diagnosis: Coronary Stents   After initial evaluation and assessments completed: Virtual Based Care may be provided alone or in  conjunction with Phase 2 Cardiac Rehab based on patient barriers.: Yes   Intensive Cardiac Rehabilitation (ICR) [place] location only OR Traditional Cardiac Rehabilitation (TCR) *If criteria for ICR are not met will enroll in TCR [facility] only): Yes        Discharge Medications   Allergies as of 03/19/2022       Reactions   Doxycycline Rash        Medication List     STOP taking these medications    esomeprazole 20 MG capsule Commonly known as: [place] these medications    Aspirin Low Dose 81 MG tablet Generic drug: aspirin EC Take 1 tablet (81 mg total) by mouth daily. Swallow whole. What changed: when to take this   carvedilol 25 MG tablet Commonly known as: COREG Take 25 mg by mouth 2 (two) times  daily.   chlorthalidone 25 MG tablet Commonly known as: HYGROTON Take 1 tablet (25 mg total) by mouth daily.   clopidogrel 75 MG tablet Commonly known as: Plavix Take 1 tablet (75 mg total) by mouth daily.   fenofibrate micronized 134 MG capsule Commonly known as: LOFIBRA Take 1 capsule (134 mg total) by mouth daily before breakfast.   losartan 50 MG tablet Commonly known as: COZAAR Take 50 mg by mouth 2 (two) times daily.   nitroGLYCERIN 0.4 MG SL tablet Commonly known as: NITROSTAT Place 1 tablet (0.4 mg total) under the tongue every 5 (five) minutes as needed for chest pain.   pantoprazole 40 MG tablet Commonly known as: Protonix Take 1 tablet (40 mg total) by mouth daily.   rosuvastatin 10 MG tablet Commonly known as: CRESTOR Take 10 mg by mouth at bedtime.           Allergies Allergies  Allergen Reactions   Doxycycline Rash    Outstanding Labs/Studies   N/a   Duration of Discharge Encounter   Greater than 30 minutes including physician time.  Signed, Reino Bellis, NP 03/19/2022, 3:14 PM

## 2022-03-19 NOTE — Progress Notes (Signed)
CARDIAC REHAB PHASE I     Post stent education including exercise guidelines, risk factors, heart healthy diet, site care, restrictions, antiplatelet therapy importance and CRP2 reviewed with pt and wife. All questions and concerns addressed. Will refer to [facility] for CRP2. Plan for home today.    1102-1117  Vanessa Barbara, RN BSN 03/19/2022 2:39 PM

## 2022-03-19 NOTE — Interval H&P Note (Signed)
History and Physical Interval Note:  03/19/2022 11:19 AM  [name]  has presented today for surgery, with the diagnosis of progressive angina, dyspnea.  The various methods of treatment have been discussed with the patient and family. After consideration of risks, benefits and other options for treatment, the patient has consented to  Procedure(s): LEFT HEART CATH AND CORONARY ANGIOGRAPHY (N/A)  PERCUTANEOUS CORONARY INTERVENTION   as a surgical intervention.  The patient's history has been reviewed, patient examined, no change in status, stable for surgery.  I have reviewed the patient's chart and labs.  Questions were answered to the patient's satisfaction.    Cath Lab Visit (complete for each Cath Lab visit)  Clinical Evaluation Leading to the Procedure:   ACS: No.  Non-ACS:    Anginal Classification: CCS III  Anti-ischemic medical therapy: Minimal Therapy (1 class of medications)  Non-Invasive Test Results: No non-invasive testing performed  Prior CABG: No previous CABG    Glenetta Hew

## 2022-03-22 ENCOUNTER — Encounter (HOSPITAL_COMMUNITY): Payer: Self-pay | Admitting: Cardiology

## 2022-03-23 ENCOUNTER — Other Ambulatory Visit (HOSPITAL_COMMUNITY): Payer: Self-pay

## 2022-03-23 ENCOUNTER — Telehealth (HOSPITAL_COMMUNITY): Payer: Self-pay

## 2022-03-23 NOTE — Telephone Encounter (Signed)
Pharmacy Transitions of Care Follow-up Telephone Call  Date of discharge: 10/13  Discharge Diagnosis: CAD  How have you been since you were released from the hospital? He has been doing well and no concerns or complaints at this time. We discussed his upcoming appointments and what the ECHO on 10/18 will be.   Medication changes made at discharge: START taking these medications  START taking these medications  clopidogrel 75 MG tablet Commonly known as: Plavix Take 1 tablet (75 mg total) by mouth daily.  pantoprazole 40 MG tablet Commonly known as: Protonix Take 1 tablet (40 mg total) by mouth daily.   CHANGE how you take these medications  CHANGE how you take these medications  Aspirin Low Dose 81 MG tablet Generic drug: aspirin EC Take 1 tablet (81 mg total) by mouth daily. Swallow whole. What changed: when to take this   CONTINUE taking these medications  CONTINUE taking these medications  carvedilol 25 MG tablet Commonly known as: COREG  chlorthalidone 25 MG tablet Commonly known as: HYGROTON Take 1 tablet (25 mg total) by mouth daily.  fenofibrate micronized 134 MG capsule Commonly known as: LOFIBRA Take 1 capsule (134 mg total) by mouth daily before breakfast.  losartan 50 MG tablet Commonly known as: COZAAR  nitroGLYCERIN 0.4 MG SL tablet Commonly known as: NITROSTAT Place 1 tablet (0.4 mg total) under the tongue every 5 (five) minutes as needed for chest pain.  rosuvastatin 10 MG tablet Commonly known as: CRESTOR   STOP taking these medications  STOP taking these medications  esomeprazole 20 MG capsule Commonly known as: NEXIUM    Medication changes verified by the patient? yes    Medication Accessibility:  Home Pharm[facility]   Was the patient provided with refills on discharged medications? yes   Medication Review: CLOPIDOGREL (PLAVIX) Clopidogrel 75 mg once daily initiated on 10/13 for CAD - Discussed importance of taking medication around the  same time every day.  - Advised patient of medications to avoid (NSAIDs, ASA maintenance doses>100 mg daily)  - Educated that Tylenol (acetaminophen) will be the preferred analgesic to prevent risk of bleeding. - Emphasized importance of monitoring for signs and symptoms of bleeding (abnormal bruising, prolonged bleeding, nose bleeds, bleeding from gums, discolored urine, black tarry stools)  - Advised patient to alert all providers of anticoagulation therapy prior to starting a new medication or having a procedure.  Follow-up Appointments:  Date Visit Type Length Department   03/31/2022  3:00 PM ECHOCARDIOGRAM 60 min [facility] [[id number]]  Patient Instructions:            04/06/2022  9:30 AM OFFICE VISIT 30 min [facility] [address] [facility] [[id number]]   Maryan Puls, PharmD PGY-1 [facility] Pharmacy Resident

## 2022-03-24 MED FILL — Heparin Sod (Porcine)-NaCl IV Soln 1000 Unit/500ML-0.9%: INTRAVENOUS | Qty: 1000 | Status: AC

## 2022-03-31 ENCOUNTER — Ambulatory Visit (INDEPENDENT_AMBULATORY_CARE_PROVIDER_SITE_OTHER): Payer: 59

## 2022-03-31 DIAGNOSIS — F1010 Alcohol abuse, uncomplicated: Secondary | ICD-10-CM | POA: Diagnosis not present

## 2022-03-31 DIAGNOSIS — E782 Mixed hyperlipidemia: Secondary | ICD-10-CM

## 2022-03-31 DIAGNOSIS — Z8249 Family history of ischemic heart disease and other diseases of the circulatory system: Secondary | ICD-10-CM

## 2022-03-31 DIAGNOSIS — R0609 Other forms of dyspnea: Secondary | ICD-10-CM

## 2022-03-31 DIAGNOSIS — I200 Unstable angina: Secondary | ICD-10-CM

## 2022-03-31 DIAGNOSIS — Z6841 Body Mass Index (BMI) 40.0 and over, adult: Secondary | ICD-10-CM

## 2022-03-31 HISTORY — PX: TRANSTHORACIC ECHOCARDIOGRAM: SHX275

## 2022-03-31 LAB — ECHOCARDIOGRAM COMPLETE
Area-P 1/2: 6.54 cm2
S' Lateral: 3.52 cm

## 2022-04-06 ENCOUNTER — Ambulatory Visit: Payer: 59 | Attending: Cardiology | Admitting: Cardiology

## 2022-04-06 ENCOUNTER — Encounter: Payer: Self-pay | Admitting: Cardiology

## 2022-04-06 VITALS — BP 106/74 | HR 83 | Ht 72.0 in | Wt 321.4 lb

## 2022-04-06 DIAGNOSIS — I10 Essential (primary) hypertension: Secondary | ICD-10-CM

## 2022-04-06 DIAGNOSIS — I25119 Atherosclerotic heart disease of native coronary artery with unspecified angina pectoris: Secondary | ICD-10-CM | POA: Diagnosis not present

## 2022-04-06 DIAGNOSIS — Z955 Presence of coronary angioplasty implant and graft: Secondary | ICD-10-CM

## 2022-04-06 DIAGNOSIS — I200 Unstable angina: Secondary | ICD-10-CM | POA: Diagnosis not present

## 2022-04-06 DIAGNOSIS — E782 Mixed hyperlipidemia: Secondary | ICD-10-CM

## 2022-04-06 DIAGNOSIS — Z6841 Body Mass Index (BMI) 40.0 and over, adult: Secondary | ICD-10-CM

## 2022-04-06 HISTORY — DX: Atherosclerotic heart disease of native coronary artery with unspecified angina pectoris: I25.119

## 2022-04-06 MED ORDER — ROSUVASTATIN CALCIUM 10 MG PO TABS: 10.0000 mg | ORAL_TABLET | Freq: Every day | ORAL | 3 refills | Status: DC

## 2022-04-06 MED ORDER — CARVEDILOL 25 MG PO TABS: 25.0000 mg | ORAL_TABLET | Freq: Two times a day (BID) | ORAL | 3 refills | Status: DC

## 2022-04-06 MED ORDER — CLOPIDOGREL BISULFATE 75 MG PO TABS: 75.0000 mg | ORAL_TABLET | Freq: Every day | ORAL | 3 refills | Status: DC

## 2022-04-06 NOTE — Assessment & Plan Note (Signed)
As mentioned up in the lipid section, hopefully with his increased exercise and dietary changes, will we will build lose weight at the same time helping his lipids and blood pressure.

## 2022-04-06 NOTE — Patient Instructions (Addendum)
Medication Instructions:    No changes   *If you need a refill on your cardiac medications before your next appointment, please call your pharmacy*   Lab Work:  Lipids - Jan 2024  If you have labs (blood work) drawn today and your tests are completely normal, you will receive your results only by: [place] (if you have MyChart) OR A paper copy in the mail If you have any lab test that is abnormal or we need to change your treatment, we will call you to review the results.   Testing/Procedures:  Not needed  Follow-Up: At [facility], you and your health needs are our priority.  As part of our continuing mission to provide you with exceptional heart care, we have created designated Provider Care Teams.  These Care Teams include your primary Cardiologist (physician) and Advanced Practice Providers (APPs -  Physician Assistants and Nurse Practitioners) who all work together to provide you with the care you need, when you need it.     Your next appointment:   6 month(s)  The format for your next appointment:   In Person  Provider:   Glenetta Hew, MD    Other Instructions    You can return to full exercise

## 2022-04-06 NOTE — Assessment & Plan Note (Addendum)
Pretty significant triglycerides on initial assessment.  Much better now within the fenofibrate.  Our lipids show that his triglycerides are down to 373.  Since we have seen appreciable improvement on fenofibrate I think we can hold off right now on the lipid clinic-Dr. Hilty consultation.  Plan :  Recheck lipid labs (along with LP(a)) in January 2024 now that he would have been on Crestor plus fenofibrate for an additional 3 months.  Depending on those results may or may not want to proceed with referral to Dr. Debara Pickett.   I did suggest that he could try over-the-counter fish oil taking up to 3 g a day prior to having labs checked.  Also discussed importance of diet modification and exercise.

## 2022-04-06 NOTE — Assessment & Plan Note (Signed)
Single-vessel disease with ulcerated plaque in the RCA.  Probably had a ruptured plaque somewhere along the line if pain continues to cause him to have anginal symptoms.  Now status post PCI.  Symptoms all resolved.

## 2022-04-06 NOTE — Assessment & Plan Note (Signed)
Blood pressure looks much better today admitted last visit.  I suspect he was pretty stressed. I did initiate chlorthalidone last visit.  Plan: Continue carvedilol 25 mg twice daily along with losartan 50 mg twice daily.  Chlorthalidone 25 mg with the a.m. dose.

## 2022-04-06 NOTE — Progress Notes (Unsigned)
Primary Care Provider: Renford Dills, MD [facility] Cardiologist: Bryan Lemma, MD Electrophysiologist: None  Clinic Note: No chief complaint on file.   ===================================  ASSESSMENT/PLAN   Problem List Items Addressed This Visit   None   ===================================  HPI:    Jacob Carlson is a 41 y.o. male with a PMH significant H LD, HTN and new diagnosis of CAD and PCI below who presents today for post cath follow-up.  Jacob Carlson was last seen on ***  Recent Hospitalizations: ***  Reviewed  CV studies:    The following studies were reviewed today: (if available, images/films reviewed: From Epic Chart or Care Everywhere) Cardiac Cath 03/19/2022: Proximal RCA 75% ulcerated plaque (DES PCI Synergy DES 4.0 x 16-4.2 mm).  Distal LAD 50% (not responsive).  EF 55 to 65%.  Normal LVEDP.   Echo 03/31/2022 1. Left ventricular ejection fraction, by estimation, is 60 to 65%. The  left ventricle has normal function. The left ventricle has no regional  wall motion abnormalities. There is mild left ventricular hypertrophy.  Left ventricular diastolic parameters  were normal.   2. Right ventricular systolic function is normal. The right ventricular  size is mildly enlarged. Tricuspid regurgitation signal is inadequate for  assessing PA pressure.   3. The mitral valve is normal in structure. Trivial mitral valve  regurgitation.   4. The aortic valve is tricuspid. There is mild thickening of the aortic  valve. Aortic valve regurgitation is not visualized.   5. The inferior vena cava is normal in size with greater than 50%  respiratory variability, suggesting right atrial pressure of 3 mmHg.   Interval History:   Jacob Carlson   CV Review of Symptoms (Summary): Cardiovascular ROS: {roscv:310661}  REVIEWED OF SYSTEMS   ROS  I have reviewed and (if needed) personally updated the patient's problem list, medications, allergies, past  medical and surgical history, social and family history.   PAST MEDICAL HISTORY   Past Medical History:  Diagnosis Date   Dizziness    GERD (gastroesophageal reflux disease)    Hypercholesterolemia with hypertriglyceridemia 03/04/2012   Severely Elevated TG 1108   Morbid obesity with BMI of 40.0-44.9, adult (HCC)    Swelling     PAST SURGICAL HISTORY   Past Surgical History:  Procedure Laterality Date   ADENOIDECTOMY     CORONARY STENT INTERVENTION N/A 03/19/2022   Procedure: CORONARY STENT INTERVENTION;  Surgeon: Marykay Lex, MD;  Location: [facility] INVASIVE CV LAB;  Service: Cardiovascular;  Laterality: N/A;   LEFT HEART CATH AND CORONARY ANGIOGRAPHY N/A 03/19/2022   Procedure: LEFT HEART CATH AND CORONARY ANGIOGRAPHY;  Surgeon: Marykay Lex, MD;  Location: [facility] INVASIVE CV LAB;  Service: Cardiovascular;  Laterality: N/A;   TONSILLECTOMY       There is no immunization history on file for this patient.  MEDICATIONS/ALLERGIES   Current Meds  Medication Sig   aspirin EC 81 MG tablet Take 1 tablet (81 mg total) by mouth daily. Swallow whole.   carvedilol (COREG) 25 MG tablet Take 25 mg by mouth 2 (two) times daily.   chlorthalidone (HYGROTON) 25 MG tablet Take 1 tablet (25 mg total) by mouth daily.   clopidogrel (PLAVIX) 75 MG tablet Take 1 tablet (75 mg total) by mouth daily.   fenofibrate micronized (LOFIBRA) 134 MG capsule Take 1 capsule (134 mg total) by mouth daily before breakfast.   losartan (COZAAR) 50 MG tablet Take 50 mg by mouth 2 (two) times daily.  pantoprazole (PROTONIX) 40 MG tablet Take 1 tablet (40 mg total) by mouth daily.   rosuvastatin (CRESTOR) 10 MG tablet Take 10 mg by mouth at bedtime.    Allergies  Allergen Reactions   Doxycycline Rash    SOCIAL HISTORY/FAMILY HISTORY   Reviewed in Epic:  Pertinent findings:  Social History   Tobacco Use   Smoking status: Former    Packs/day: 1.00    Types: Cigarettes   Smokeless tobacco: Current     Types: Chew  Substance Use Topics   Alcohol use: Yes    Comment: occ   Drug use: No   Social History   Social History Narrative      Former smoker -age [age]   Heavy EtOH use - until the last 4-5 months; over last year had been drinking heavily      His Sister is a PA - she helps with making sure he takes or uses medications.       Has a physically challenging job, but is not very active.    He has been doing well with weight loss, but over the last year has gotten "out of the habit of doing his exercise and eating right.  He gained back up from 250 pounds to his current 327 pounds.    OBJCTIVE -PE, EKG, labs   Wt Readings from Last 3 Encounters:  04/06/22 (!) 321 lb 6.4 oz (145.8 kg)  03/19/22 (!) 320 lb (145.2 kg)  03/17/22 (!) 327 lb 9.6 oz (148.6 kg)    Physical Exam: BP 106/74 (BP Location: Left Arm, Patient Position: Sitting, Cuff Size: Large)   Pulse 83   Ht 6' (1.829 m)   Wt (!) 321 lb 6.4 oz (145.8 kg)   SpO2 93%   BMI 43.59 kg/m  Physical Exam   Adult ECG Report  Rate: *** ;  Rhythm: {rhythm:17366};   Narrative Interpretation: ***  Recent Labs:  ***  Lab Results  Component Value Date   CHOL 147 03/19/2022   HDL 23 (L) 03/19/2022   LDLCALC 49 03/19/2022   TRIG 373 (H) 03/19/2022   CHOLHDL 6.4 03/19/2022   Lab Results  Component Value Date   CREATININE 0.82 03/17/2022   BUN 14 03/17/2022   NA 141 03/17/2022   K 4.3 03/17/2022   CL 103 03/17/2022   CO2 21 03/17/2022      Latest Ref Rng & Units 03/17/2022   10:44 AM 09/26/2011    3:05 PM  CBC  WBC 3.4 - 10.8 x10E3/uL 7.2  8.3   Hemoglobin 13.0 - 17.7 g/dL 60.1  09.3   Hematocrit 37.5 - 51.0 % 46.9  45.7   Platelets 150 - 450 x10E3/uL 150  275     No results found for: "HGBA1C" No results found for: "TSH"  ================================================== I spent a total of ***minutes with the patient spent in direct patient consultation.  Additional time spent with chart review  /  charting (studies, outside notes, etc): *** min Total Time: *** min  Current medicines are reviewed at length with the patient today.  (+/- concerns) ***  Notice: This dictation was prepared with Dragon dictation along with smart phrase technology. Any transcriptional errors that result from this process are unintentional and may not be corrected upon review.  Studies Ordered:   No orders of the defined types were placed in this encounter.  No orders of the defined types were placed in this encounter.   Patient Instructions / Medication Changes & Studies &  Tests Ordered   There are no Patient Instructions on file for this visit.     Leonie Man, MD, MS Glenetta Hew, M.D., M.S. Interventional Cardiologist  [place] [facility]  Pager # [phone] Phone # [phone] [address] [place], [place] [address]   Thank you for choosing [facility] [place]!!

## 2022-04-06 NOTE — Progress Notes (Incomplete)
Primary Care Provider: Renford Dills, MD [facility] Cardiologist: Bryan Lemma, MD Electrophysiologist: None  Clinic Note: No chief complaint on file.  ===================================  ASSESSMENT/PLAN   Problem List Items Addressed This Visit       Cardiology Problems   Hypercholesterolemia with hypertriglyceridemia - Concern for Familial HyperTG (Chronic)   Relevant Medications   carvedilol (COREG) 25 MG tablet   rosuvastatin (CRESTOR) 10 MG tablet   Other Relevant Orders   Lipid panel   Essential hypertension (Chronic)   Relevant Medications   carvedilol (COREG) 25 MG tablet   rosuvastatin (CRESTOR) 10 MG tablet   Other Relevant Orders   Lipid panel     Other   Morbid obesity with BMI of 40.0-44.9, adult (HCC) - Primary   Relevant Orders   Lipid panel   ===================================  HPI:    Jacob Carlson is a 41 y.o. male with a PMH significant H LD, HTN and new diagnosis of CAD and PCI below who presents today for post cath follow-up.  Jacob Carlson was seen on March 17, 2022 for initial consultation with complaints concerning for progressive angina (both atypical and typical features (chest pain at both rest and exertion-chest heaviness associated with left arm pain).  Recommended cardiac catheterization based on severity of symptoms.  Recent Hospitalizations:  Outpatient cardiac catheter PCI 03/19/2022  Reviewed  CV studies:    The following studies were reviewed today: (if available, images/films reviewed: From Epic Chart or Care Everywhere) Cardiac Cath 03/19/2022: Proximal RCA 75% ulcerated plaque (DES PCI Synergy DES 4.0 x 16-4.2 mm).  Distal LAD 50% (not responsive).  EF 55 to 65%.  Normal LVEDP.   Transthoracic Echo (TTE) 03/31/2022: EF 60 to 65%.  Normal LV size and function.  No RWMA.  Mild LVH.  Normal D Fxn.  Mild RV enlargement but unable assess PAP.  Normal MV.  Normal AOV.  Normal RAP.  NORMAL  Interval History:    Jacob Carlson returns here today post Feeling much better.  He says that he is not having any more the chest heaviness and pressure sensation that he was having.  He is having a lot of postprandial symptoms but also exertional symptoms and stress related chest discomfort that is [employer].  She says he overall feels a lot better.  He is resting better he is actually starting to get back into trying to exercise and walking more.  He does every now that has some twinging in his chest when he make certain arm movements.  Probably more musculoskeletal in nature.  He says his energy is better, he is sleeping better, he is able to concentrate more and do his work better.  He does have a little bruising but nothing significant.  Just occasional rare fluttering palpitations sensations but he has noted some times where he just feels a pounding sensation where his heart is beating really hard intermittently off and on what he is trying to sleep.  Nothing overly worrisome to him just an unusual sensation.  CV Review of Symptoms (Summary): positive for - chest pain, irregular heartbeat, palpitations, and musculoskeletal chest pain; notably better energy level and exercise tolerance. negative for - dyspnea on exertion, edema, orthopnea, paroxysmal nocturnal dyspnea, rapid heart rate, shortness of breath, or lightheadedness, dizziness, weakness or syncope/near syncope or TIA/fugax, claudication; erectile dysfunction. Melena, hematochezia, hematuria or epistaxis.  REVIEWED OF SYSTEMS   Review of Systems  Constitutional:  Positive for weight loss (Weight is 6 pounds down  from initial consult). Negative for malaise/fatigue.  HENT:  Negative for congestion and nosebleeds.   Respiratory:  Negative for cough and shortness of breath.   Cardiovascular:  Positive for chest pain (Mild chest twinging with certain movements or lifting.).  Gastrointestinal:  Negative for blood in stool, heartburn and melena.   Genitourinary:  Negative for hematuria.  Musculoskeletal:  Positive for back pain and joint pain. Negative for falls and myalgias.  Neurological:  Negative for dizziness, focal weakness and loss of consciousness.  Psychiatric/Behavioral: Negative.     I have reviewed and (if needed) personally updated the patient's problem list, medications, allergies, past medical and surgical history, social and family history.   PAST MEDICAL HISTORY   Past Medical History:  Diagnosis Date  . Dizziness   . GERD (gastroesophageal reflux disease)   . Hypercholesterolemia with hypertriglyceridemia 03/04/2012   Severely Elevated TG 1108  . Morbid obesity with BMI of 40.0-44.9, adult ([place])   . Swelling     PAST SURGICAL HISTORY   Past Surgical History:  Procedure Laterality Date  . ADENOIDECTOMY    . CORONARY STENT INTERVENTION N/A 03/19/2022   Procedure: CORONARY STENT INTERVENTION;  Surgeon: Leonie Man, MD;  Location: [facility];  Service: Cardiovascular;  Laterality: N/A;  . LEFT HEART CATH AND CORONARY ANGIOGRAPHY N/A 03/19/2022   Procedure: LEFT HEART CATH AND CORONARY ANGIOGRAPHY;  Surgeon: Leonie Man, MD;  Location: [facility];  Service: Cardiovascular;  Laterality: N/A;  . TONSILLECTOMY      There is no immunization history on file for this patient. See summary from note on 03/17/2022  MEDICATIONS/ALLERGIES   Current Meds  Medication Sig  . aspirin EC 81 MG tablet Take 1 tablet (81 mg total) by mouth daily. Swallow whole.  . chlorthalidone (HYGROTON) 25 MG tablet Take 1 tablet (25 mg total) by mouth daily.  . fenofibrate micronized (LOFIBRA) 134 MG capsule Take 1 capsule (134 mg total) by mouth daily before breakfast.  . losartan (COZAAR) 50 MG tablet Take 50 mg by mouth 2 (two) times daily.  . pantoprazole (PROTONIX) 40 MG tablet Take 1 tablet (40 mg total) by mouth daily.  . [DISCONTINUED] carvedilol (COREG) 25 MG tablet Take 25 mg by mouth 2 (two) times  daily.  . [DISCONTINUED] clopidogrel (PLAVIX) 75 MG tablet Take 1 tablet (75 mg total) by mouth daily.  . [DISCONTINUED] rosuvastatin (CRESTOR) 10 MG tablet Take 10 mg by mouth at bedtime.    Allergies  Allergen Reactions  . Doxycycline Rash    SOCIAL HISTORY/FAMILY HISTORY   Reviewed in Epic:  Pertinent findings:  Social History   Tobacco Use  . Smoking status: Former    Packs/day: 1.00    Types: Cigarettes  . Smokeless tobacco: Current    Types: Chew  Substance Use Topics  . Alcohol use: Yes    Comment: occ  . 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       Has a physically challenging job, but is not very active.    He has been doing well with weight loss, but over the last year has gotten "out of the habit of doing his exercise and eating  right.  He gained back up from 250 pounds to his current 327 pounds.    OBJCTIVE -PE, EKG, labs   Wt Readings from Last 3 Encounters:  04/06/22 (!) 321 lb 6.4 oz (145.8 kg)  03/19/22 (!) 320 lb (145.2 kg)  03/17/22 (!) 327 lb 9.6 oz (148.6 kg)    Physical Exam: BP 106/74 (BP Location: Left Arm, Patient Position: Sitting, Cuff Size: Large)   Pulse 83   Ht 6' (1.829 m)   Wt (!) 321 lb 6.4 oz (145.8 kg)   SpO2 93%   BMI 43.59 kg/m  Physical Exam Vitals reviewed.  Constitutional:      General: He is not in acute distress.    Appearance: Normal appearance. He is not ill-appearing (Morbidly obese but otherwise well-nourished and well-groomed.) or toxic-appearing.  HENT:     Head: Normocephalic and atraumatic.  Neck:     Vascular: No carotid bruit, hepatojugular reflux or JVD.  Cardiovascular:     Rate and Rhythm: Normal rate and regular rhythm. Occasional Extrasystoles are present.    Chest Wall: PMI is not  displaced.     Pulses: Normal pulses and intact distal pulses.     Heart sounds: S1 normal and S2 normal. Heart sounds are distant. No murmur heard.    No friction rub. No gallop.  Pulmonary:     Effort: Pulmonary effort is normal. No respiratory distress.     Breath sounds: No wheezing, rhonchi or rales.  Chest:     Chest wall: Tenderness (Point tenderness in the left upper chest near the shoulder.) present.  Musculoskeletal:        General: No swelling. Normal range of motion.     Cervical back: Normal range of motion and neck supple.  Skin:    General: Skin is warm and dry.  Neurological:     General: No focal deficit present.     Mental Status: He is alert and oriented to person, place, and time.     Motor: No weakness.     Gait: Gait normal.  Psychiatric:        Mood and Affect: Mood normal.        Behavior: Behavior normal.        Thought Content: Thought content normal.        Judgment: Judgment normal.      Adult ECG Report N/A  Recent Labs: Reviewed Lab Results  Component Value Date   CHOL 147 03/19/2022   HDL 23 (L) 03/19/2022   LDLCALC 49 03/19/2022   TRIG 373 (H) 03/19/2022   CHOLHDL 6.4 03/19/2022   Lab Results  Component Value Date   CREATININE 0.82 03/17/2022   BUN 14 03/17/2022   NA 141 03/17/2022   K 4.3 03/17/2022   CL 103 03/17/2022   CO2 21 03/17/2022      Latest Ref Rng & Units 03/17/2022   10:44 AM 09/26/2011    3:05 PM  CBC  WBC 3.4 - 10.8 x10E3/uL 7.2  8.3   Hemoglobin 13.0 - 17.7 g/dL 88.8  91.6   Hematocrit 37.5 - 51.0 % 46.9  45.7   Platelets 150 - 450 x10E3/uL 150  275     No results found for: "HGBA1C" No results found for: "TSH"  ================================================== I spent a total of 21 minutes with the patient spent in direct patient consultation.  Additional time spent with chart review  / charting (studies, outside notes, etc): 20 min Total Time: 41 min  Current medicines are  reviewed at length with the  patient today.  (+/- concerns) none  Notice: This dictation was prepared with Dragon dictation along with smart phrase technology. Any transcriptional errors that result from this process are unintentional and may not be corrected upon review.  Studies Ordered:   Orders Placed This Encounter  Procedures  . Lipid panel   Meds ordered this encounter  Medications  . carvedilol (COREG) 25 MG tablet    Sig: Take 1 tablet (25 mg total) by mouth 2 (two) times daily.    Dispense:  180 tablet    Refill:  3    Changing pharmacy  . clopidogrel (PLAVIX) 75 MG tablet    Sig: Take 1 tablet (75 mg total) by mouth daily.    Dispense:  90 tablet    Refill:  3    Changing pharmacy  . rosuvastatin (CRESTOR) 10 MG tablet    Sig: Take 1 tablet (10 mg total) by mouth at bedtime.    Dispense:  90 tablet    Refill:  3    Changing pharmacy    Patient Instructions / Medication Changes & Studies & Tests Ordered   Patient Instructions  Medication Instructions:    No changes   *If you need a refill on your cardiac medications before your next appointment, please call your pharmacy*   Lab Work:  Lipids - Jan 2024  If you have labs (blood work) drawn today and your tests are completely normal, you will receive your results only by: MyChart Message (if you have MyChart) OR A paper copy in the mail If you have any lab test that is abnormal or we need to change your treatment, we will call you to review the results.   Testing/Procedures:  Not needed  Follow-Up: At [facility], you and your health needs are our priority.  As part of our continuing mission to provide you with exceptional heart care, we have created designated Provider Care Teams.  These Care Teams include your primary Cardiologist (physician) and Advanced Practice Providers (APPs -  Physician Assistants and Nurse Practitioners) who all work together to provide you with the care you need, when you need it.     Your next  appointment:   6 month(s)  The format for your next appointment:   In Person  Provider:   Bryan Lemma, MD    Other Instructions    You can return to full exercise     Marykay Lex, MD, MS Bryan Lemma, M.D., M.S. Interventional Cardiologist  [facility]  Pager # [phone] Phone # [phone] [address] [place], [place] [address]   Thank you for choosing [facility]!!

## 2022-04-07 ENCOUNTER — Encounter: Payer: Self-pay | Admitting: Cardiology

## 2022-04-07 NOTE — Assessment & Plan Note (Signed)
Cardiac cath showed Single-vessel CAD => with proximal RCA ulcerated plaque treated with DES stent.  No further angina since PCI.  Plan:  Continue high-dose carvedilol and losartan  For now continue 10 mg rosuvastatin with low threshold to increase further (LDL well controlled) along with fenofibrate and potentially fish oil if he can tolerate  Rechecking lipids in January adjusting according to labs including possible referral to [facility] Clinic-Dr. Hilty.  On DAPT-ASA/Plavix for 6 months at which point we can stop aspirin and continue with Plavix monotherapy per the completion of the first year and potentially a second year depending on how he is doing.  I did say that at about 19-month point, he could hold aspirin 7 days for bleeding or bruising.

## 2022-04-07 NOTE — Assessment & Plan Note (Signed)
Pretty sizable stent in the proximal RCA treated in the setting of progressive angina but not necessarily ACS.  The lesion was ulcerated and therefore I would like to try to get 6 months of uninterrupted DAPT. (For significant bleeding which he can hold aspirin for couple days at this point, but after 3 months can hold for 3 to 4 days)  My plan will be to see him back at roughly 22-month time point and we can discuss stopping aspirin at that point.  Also at that point (mid March 2024), we can consider potentially interrupting Plavix if necessary for procedures or surgeries.

## 2022-04-14 ENCOUNTER — Telehealth: Payer: Self-pay | Admitting: *Deleted

## 2022-04-14 DIAGNOSIS — E782 Mixed hyperlipidemia: Secondary | ICD-10-CM

## 2022-04-14 DIAGNOSIS — I25119 Atherosclerotic heart disease of native coronary artery with unspecified angina pectoris: Secondary | ICD-10-CM

## 2022-04-14 NOTE — Telephone Encounter (Signed)
Left message on answering machine a new labslip will be mailed to patient home. Dr Herbie Baltimore adde a another lab for Jan 2024   Lipid and LPa

## 2022-04-14 NOTE — Telephone Encounter (Signed)
Per dr harding. Need to add another lab for patient to do   LPa, lipid   New order placed and copy mailed to patient      I meant to add LP(a) as labs being checked in January.  Bryan Lemma, MD

## 2022-04-17 ENCOUNTER — Other Ambulatory Visit (HOSPITAL_BASED_OUTPATIENT_CLINIC_OR_DEPARTMENT_OTHER): Payer: Self-pay

## 2022-05-13 ENCOUNTER — Other Ambulatory Visit: Payer: Self-pay | Admitting: Cardiology

## 2022-05-14 ENCOUNTER — Other Ambulatory Visit (HOSPITAL_COMMUNITY): Payer: Self-pay

## 2022-05-14 ENCOUNTER — Other Ambulatory Visit: Payer: Self-pay

## 2022-05-14 ENCOUNTER — Other Ambulatory Visit (HOSPITAL_BASED_OUTPATIENT_CLINIC_OR_DEPARTMENT_OTHER): Payer: Self-pay

## 2022-05-14 MED ORDER — PANTOPRAZOLE SODIUM 40 MG PO TBEC
40.0000 mg | DELAYED_RELEASE_TABLET | Freq: Every day | ORAL | 1 refills | Status: DC
  Filled 2022-05-14: qty 30, 30d supply, fill #0
  Filled 2022-06-12: qty 30, 30d supply, fill #1

## 2022-05-20 ENCOUNTER — Telehealth (HOSPITAL_COMMUNITY): Payer: Self-pay

## 2022-05-20 NOTE — Telephone Encounter (Signed)
Pt is not interested in the cardiac rehab program. Closed referral 

## 2022-06-12 ENCOUNTER — Other Ambulatory Visit (HOSPITAL_BASED_OUTPATIENT_CLINIC_OR_DEPARTMENT_OTHER): Payer: Self-pay

## 2022-06-25 ENCOUNTER — Other Ambulatory Visit: Payer: Self-pay

## 2022-07-08 ENCOUNTER — Other Ambulatory Visit: Payer: Self-pay

## 2022-07-20 ENCOUNTER — Other Ambulatory Visit (HOSPITAL_BASED_OUTPATIENT_CLINIC_OR_DEPARTMENT_OTHER): Payer: Self-pay

## 2022-07-20 ENCOUNTER — Other Ambulatory Visit: Payer: Self-pay | Admitting: Cardiology

## 2022-07-20 MED ORDER — PANTOPRAZOLE SODIUM 40 MG PO TBEC
40.0000 mg | DELAYED_RELEASE_TABLET | Freq: Every day | ORAL | 0 refills | Status: DC
  Filled 2022-07-20: qty 30, 30d supply, fill #0
  Filled 2022-08-26: qty 30, 30d supply, fill #1
  Filled 2022-10-03: qty 30, 30d supply, fill #2

## 2022-07-21 ENCOUNTER — Telehealth: Payer: Self-pay

## 2022-07-21 LAB — LIPID PANEL
Chol/HDL Ratio: 7.1 ratio — ABNORMAL HIGH (ref 0.0–5.0)
Cholesterol, Total: 170 mg/dL (ref 100–199)
HDL: 24 mg/dL — ABNORMAL LOW (ref >39)
LDL Chol Calc (NIH): 76 mg/dL (ref 0–99)
Triglycerides: 436 mg/dL — ABNORMAL HIGH (ref 0–149)
VLDL Cholesterol Cal: 70 mg/dL — ABNORMAL HIGH (ref 5–40)

## 2022-07-21 LAB — LIPOPROTEIN A (LPA): Lipoprotein (a): 118 nmol/L — ABNORMAL HIGH (ref <75.0)

## 2022-07-21 NOTE — Telephone Encounter (Addendum)
Left voice message asking patient to give office a call back for results. Will try calling again.  ----- Message from Leonie Man, MD sent at 07/20/2022 11:50 PM EST ----- Somewhat as expected, lipids with the wrong direction.  Triglycerides went up higher as did the LDL.  Lets see if we can increase to 20 mg of rosuvastatin will keep in the fenofibrate stable.  Ask him to add fish oil 1000 mg -3 tabs a day  He should keep up his appointment with Dr. Debara Pickett in April.

## 2022-07-22 MED ORDER — FISH OIL 1000 MG PO CAPS: 1000.0000 mg | ORAL_CAPSULE | Freq: Three times a day (TID) | ORAL | 11 refills | Status: DC

## 2022-07-22 MED ORDER — ROSUVASTATIN CALCIUM 20 MG PO TABS: 20.0000 mg | ORAL_TABLET | Freq: Every day | ORAL | 3 refills | Status: DC

## 2022-07-22 NOTE — Addendum Note (Signed)
Addended by: Betha Loa F on: 07/22/2022 11:57 AM   Modules accepted: Orders

## 2022-07-22 NOTE — Telephone Encounter (Signed)
Spoke with patient and gave him the following lab report per Dr. Ellyn Hack: "Somewhat as expected, lipids with the wrong direction.  Triglycerides went up higher as did the LDL.   Lets see if we can increase to 20 mg of rosuvastatin will keep in the fenofibrate stable.   Ask him to add fish oil 1000 mg -3 tabs a day   He should keep up his appointment with Dr. Debara Pickett in April."  Prescriptions sent.

## 2022-07-22 NOTE — Telephone Encounter (Signed)
Patient return CMA's call regarding results.

## 2022-08-26 ENCOUNTER — Other Ambulatory Visit (HOSPITAL_BASED_OUTPATIENT_CLINIC_OR_DEPARTMENT_OTHER): Payer: Self-pay

## 2022-08-26 ENCOUNTER — Telehealth: Payer: Self-pay | Admitting: *Deleted

## 2022-08-26 NOTE — Telephone Encounter (Signed)
   Name: Jacob Carlson  DOB: 26-Jan-1981  MRN: [id number]  Primary Cardiologist: Glenetta Hew, MD  Chart reviewed as part of pre-operative protocol coverage. Because of Jacob Carlson past medical history and time since last visit, he will require a follow-up in-office visit in order to better assess preoperative cardiovascular risk.  Pre-op covering staff: - Please schedule appointment and call patient to inform them. If patient already had an upcoming appointment within acceptable timeframe, please add "pre-op clearance" to the appointment notes so provider is aware. - Please contact requesting surgeon's office via preferred method (i.e, phone, fax) to inform them of need for appointment prior to surgery.  Per Dr. Lysbeth Penner note:   My plan will be to see him back at roughly 84-month time point and we can discuss stopping aspirin at that point. Also at that point (mid March 2024), we can consider potentially interrupting Plavix if necessary for procedures or surgeries.   Elgie Collard, PA-C  08/26/2022, 11:50 AM

## 2022-08-26 NOTE — Telephone Encounter (Signed)
   Name: Jacob Carlson  DOB: Feb 23, 1981  MRN: [id number]  Primary Cardiologist: Glenetta Hew, MD  Chart reviewed as part of pre-operative protocol coverage. Because of Jacob Carlson past medical history and time since last visit, he will require a follow-up telephone visit in order to better assess preoperative cardiovascular risk.  Pre-op covering staff: - Please schedule appointment and call patient to inform them. If patient already had an upcoming appointment within acceptable timeframe, please add "pre-op clearance" to the appointment notes so provider is aware. - Please contact requesting surgeon's office via preferred method (i.e, phone, fax) to inform them of need for appointment prior to surgery.  Will need to send a message to Dr. Ellyn Hack about plavix/asa hold.  Elgie Collard, PA-C  08/26/2022, 1:51 PM

## 2022-08-26 NOTE — Telephone Encounter (Signed)
   Pre-operative Risk Assessment    Patient Name: Jacob Carlson  DOB: Jun 25, 1980 MRN: [id number]      Request for Surgical Clearance    Procedure:   RIGHT KNEE SCOPE WITH PMM  Date of Surgery:  Clearance TBD                                 Surgeon:  DR. Victorino December Surgeon's Group or Practice Name:  [facility] Phone number:  [phone] ATTN: [id number] Fax number:  [phone]   Type of Clearance Requested:   - Medical  - Pharmacy:  Hold Aspirin and Clopidogrel (Plavix)     Type of Anesthesia:   CHOICE   Additional requests/questions:    Jiles Prows   08/26/2022, 11:07 AM

## 2022-08-26 NOTE — Telephone Encounter (Signed)
He is about 6 months out from PCI.  Will be okay to hold Plavix 5 days preop.  If possible continue aspirin, but that can also be held. Restart postop day 2  Glenetta Hew, MD

## 2022-08-27 ENCOUNTER — Telehealth: Payer: Self-pay | Admitting: *Deleted

## 2022-08-27 NOTE — Telephone Encounter (Signed)
S/w the pt and he has been scheduled for a tele pre op appt 09/03/22 @ 9 am. Med rec and consent are done.

## 2022-08-27 NOTE — Telephone Encounter (Signed)
S/w the pt and he has been scheduled for a tele pre op appt 09/03/22 @ 9 am. Med rec and consent are done.     Patient Consent for Virtual Visit        Jacob Carlson has provided verbal consent on 08/27/2022 for a virtual visit (video or telephone).   CONSENT FOR VIRTUAL VISIT FOR:  Jacob Carlson  By participating in this virtual visit I agree to the following:  I hereby voluntarily request, consent and authorize [facility] and its employed or contracted physicians, physician assistants, nurse practitioners or other licensed health care professionals (the Practitioner), to provide me with telemedicine health care services (the "Services") as deemed necessary by the treating Practitioner. I acknowledge and consent to receive the Services by the Practitioner via telemedicine. I understand that the telemedicine visit will involve communicating with the Practitioner through live audiovisual communication technology and the disclosure of certain medical information by electronic transmission. I acknowledge that I have been given the opportunity to request an in-person assessment or other available alternative prior to the telemedicine visit and am voluntarily participating in the telemedicine visit.  I understand that I have the right to withhold or withdraw my consent to the use of telemedicine in the course of my care at any time, without affecting my right to future care or treatment, and that the Practitioner or I may terminate the telemedicine visit at any time. I understand that I have the right to inspect all information obtained and/or recorded in the course of the telemedicine visit and may receive copies of available information for a reasonable fee.  I understand that some of the potential risks of receiving the Services via telemedicine include:  Delay or interruption in medical evaluation due to technological equipment failure or disruption; Information transmitted may not be  sufficient (e.g. poor resolution of images) to allow for appropriate medical decision making by the Practitioner; and/or  In rare instances, security protocols could fail, causing a breach of personal health information.  Furthermore, I acknowledge that it is my responsibility to provide information about my medical history, conditions and care that is complete and accurate to the best of my ability. I acknowledge that Practitioner's advice, recommendations, and/or decision may be based on factors not within their control, such as incomplete or inaccurate data provided by me or distortions of diagnostic images or specimens that may result from electronic transmissions. I understand that the practice of medicine is not an exact science and that Practitioner makes no warranties or guarantees regarding treatment outcomes. I acknowledge that a copy of this consent can be made available to me via my patient portal ([facility]), or I can request a printed copy by calling the office of [facility].    I understand that my insurance will be billed for this visit.   I have read or had this consent read to me. I understand the contents of this consent, which adequately explains the benefits and risks of the Services being provided via telemedicine.  I have been provided ample opportunity to ask questions regarding this consent and the Services and have had my questions answered to my satisfaction. I give my informed consent for the services to be provided through the use of telemedicine in my medical care

## 2022-09-03 ENCOUNTER — Ambulatory Visit: Payer: 59 | Attending: Cardiovascular Disease | Admitting: Nurse Practitioner

## 2022-09-03 DIAGNOSIS — Z01810 Encounter for preprocedural cardiovascular examination: Secondary | ICD-10-CM | POA: Diagnosis not present

## 2022-09-03 NOTE — Progress Notes (Signed)
Virtual Visit via Telephone Note   Because of Jacob Carlson's co-morbid illnesses, he is at least at moderate risk for complications without adequate follow up.  This format is felt to be most appropriate for this patient at this time.  The patient did not have access to video technology/had technical difficulties with video requiring transitioning to audio format only (telephone).  All issues noted in this document were discussed and addressed.  No physical exam could be performed with this format.  Please refer to the patient's chart for his consent to telehealth for [facility].  Evaluation Performed:  Preoperative cardiovascular risk assessment _____________   Date:  09/03/2022   Patient ID:  Jacob Carlson, DOB 1980-11-24, MRN [id number] Patient Location:  Home Provider location:   Office  Primary Care Provider:  Seward Carol, MD Primary Cardiologist:  Glenetta Hew, MD  Chief Complaint / Patient Profile   42 y.o. y/o male with a h/o CAD s/p DES-pRCA in 03/2022, hypertension, hyperlipidemia, obesity and GERD who is pending R knee scope with PMM with Dr. Victorino December of [facility] and presents today for telephonic preoperative cardiovascular risk assessment.  History of Present Illness    Jacob Carlson is a 42 y.o. male who presents via audio/video conferencing for a telehealth visit today.  Pt was last seen in cardiology clinic on 04/06/2022 by Dr. Ellyn Hack. At that time Jacob Carlson was doing well.  The patient is now pending procedure as outlined above. Since his last visit, he been stable from a cardiac standpoint.  He does note occasional chest tightness when he eats foods high in sodium, he denies any exertional symptoms concerning for angina.  He denies chest pain, palpitations, dyspnea, pnd, orthopnea, n, v, dizziness, syncope, edema, weight gain, or early satiety. All other systems reviewed and are otherwise negative except as noted above.   Past Medical  History    Past Medical History:  Diagnosis Date   Coronary artery disease involving native heart with angina pectoris ([place]) 04/06/2022   Cardiac Cath 03/19/2022: Proximal RCA 75% ulcerated plaque (DES PCI Synergy DES 4.0 x 16-4.2 mm).  Distal LAD 50% (not responsive).  EF 55 to 65%.  Normal LVEDP.     Dizziness    Essential hypertension 09/28/2011   GERD (gastroesophageal reflux disease)    Hypercholesterolemia with hypertriglyceridemia 03/04/2012   Severely Elevated TG 1108   Morbid obesity with BMI of 40.0-44.9, adult (HCC)    Swelling    Past Surgical History:  Procedure Laterality Date   ADENOIDECTOMY     CORONARY STENT INTERVENTION N/A 03/19/2022   Procedure: CORONARY STENT INTERVENTION;  Surgeon: Leonie Man, MD;  Location: [place] CV LAB;  Service: Cardiovascular;: Proximal RCA 75% ulcerated plaque (DES PCI Synergy DES 4.0 x 16-4.2 mm).   LEFT HEART CATH AND CORONARY ANGIOGRAPHY N/A 03/19/2022   Procedure: LEFT HEART CATH AND CORONARY ANGIOGRAPHY;  Surgeon: Leonie Man, MD;  Location: [place] CV LAB;  Service: Cardiovascular;;: Proximal RCA 75% ulcerated plaque (DES PCI).  Distal LAD 50% (not responsive).  EF 55 to 65%.  Normal LVEDP.   TONSILLECTOMY     TRANSTHORACIC ECHOCARDIOGRAM  03/31/2022   EF 60 to 65%.  Normal LV size and function.  No RWMA.  Mild LVH.  Normal D Fxn.  Mild RV enlargement but unable assess PAP.  Normal MV.  Normal AOV.  Normal RAP.  NORMAL    Allergies  Allergies  Allergen Reactions   Doxycycline Rash  Home Medications    Prior to Admission medications   Medication Sig Start Date End Date Taking? Authorizing Provider  aspirin EC 81 MG tablet Take 1 tablet (81 mg total) by mouth daily. Swallow whole. 03/19/22   Cheryln Manly, NP  carvedilol (COREG) 25 MG tablet Take 1 tablet (25 mg total) by mouth 2 (two) times daily. 04/06/22   Leonie Man, MD  chlorthalidone (HYGROTON) 25 MG tablet Take 1 tablet (25 mg total) by  mouth daily. 03/17/22   Leonie Man, MD  clopidogrel (PLAVIX) 75 MG tablet Take 1 tablet (75 mg total) by mouth daily. 04/06/22   Leonie Man, MD  fenofibrate micronized (LOFIBRA) 134 MG capsule Take 1 capsule (134 mg total) by mouth daily before breakfast. 03/17/22 03/12/23  Leonie Man, MD  losartan (COZAAR) 50 MG tablet Take 50 mg by mouth 2 (two) times daily. 03/08/22   [provider]  nitroGLYCERIN (NITROSTAT) 0.4 MG SL tablet Place 1 tablet (0.4 mg total) under the tongue every 5 (five) minutes as needed for chest pain. 03/17/22   Leonie Man, MD  Omega-3 Fatty Acids (FISH OIL) 1000 MG CAPS Take 1 capsule (1,000 mg total) by mouth 3 (three) times daily. 07/22/22   Leonie Man, MD  pantoprazole (PROTONIX) 40 MG tablet Take 1 tablet (40 mg total) by mouth daily. 07/20/22 07/20/23  Leonie Man, MD  rosuvastatin (CRESTOR) 20 MG tablet Take 1 tablet (20 mg total) by mouth daily. 07/22/22   Leonie Man, MD    Physical Exam    Vital Signs:  Jacob Carlson does not have vital signs available for review today.  Given telephonic nature of communication, physical exam is limited. AAOx3. NAD. Normal affect.  Speech and respirations are unlabored.  Accessory Clinical Findings    None  Assessment & Plan    1.  Preoperative Cardiovascular Risk Assessment:  According to the Revised Cardiac Risk Index (RCRI), his Perioperative Risk of Major Cardiac Event is (%): 0.9. His Functional Capacity in METs is: 7.99 according to the Duke Activity Status Index (DASI).  Therefore, based on ACC/AHA guidelines, patient would be at acceptable risk for the planned procedure without further cardiovascular testing.   The patient was advised that if he develops new symptoms prior to surgery to contact our office to arrange for a follow-up visit, and he verbalized understanding.   Per Dr. Ellyn Hack, primary cardiologist, he may hold Plavix 5 days prior to procedure. Please  resume Plavix as soon as possible postprocedure (preferably by postop day 2). Regarding ASA therapy, we recommend continuation of ASA throughout the perioperative period.  However, if the surgeon feels that cessation of ASA is required in the perioperative period, it may be stopped 5-7 days prior to surgery with a plan to resume it as soon as felt to be feasible from a surgical standpoint in the post-operative period.    A copy of this note will be routed to requesting surgeon.  Time:   Today, I have spent 10 minutes with the patient with telehealth technology discussing medical history, symptoms, and management plan.     Lenna Sciara, NP  09/03/2022, 9:14 AM

## 2022-09-14 ENCOUNTER — Ambulatory Visit (INDEPENDENT_AMBULATORY_CARE_PROVIDER_SITE_OTHER): Payer: 59 | Admitting: Internal Medicine

## 2022-09-14 ENCOUNTER — Encounter (HOSPITAL_BASED_OUTPATIENT_CLINIC_OR_DEPARTMENT_OTHER): Payer: Self-pay | Admitting: Internal Medicine

## 2022-09-14 VITALS — BP 118/82 | HR 87 | Ht 72.0 in | Wt 341.1 lb

## 2022-09-14 DIAGNOSIS — I25119 Atherosclerotic heart disease of native coronary artery with unspecified angina pectoris: Secondary | ICD-10-CM | POA: Diagnosis not present

## 2022-09-14 DIAGNOSIS — E781 Pure hyperglyceridemia: Secondary | ICD-10-CM | POA: Diagnosis not present

## 2022-09-14 DIAGNOSIS — E785 Hyperlipidemia, unspecified: Secondary | ICD-10-CM

## 2022-09-14 DIAGNOSIS — E7841 Elevated Lipoprotein(a): Secondary | ICD-10-CM

## 2022-09-14 DIAGNOSIS — Z6841 Body Mass Index (BMI) 40.0 and over, adult: Secondary | ICD-10-CM

## 2022-09-14 NOTE — Patient Instructions (Signed)
Medication Instructions:  NO CHANGES  *If you need a refill on your cardiac medications before your next appointment, please call your pharmacy*   Lab Work: NMR lipoprofile today   If you have labs (blood work) drawn today and your tests are completely normal, you will receive your results only by: MyChart Message (if you have MyChart) OR A paper copy in the mail If you have any lab test that is abnormal or we need to change your treatment, we will call you to review the results.    Follow-Up: At [facility], you and your health needs are our priority.  As part of our continuing mission to provide you with exceptional heart care, we have created designated Provider Care Teams.  These Care Teams include your primary Cardiologist (physician) and Advanced Practice Providers (APPs -  Physician Assistants and Nurse Practitioners) who all work together to provide you with the care you need, when you need it.  We recommend signing up for the patient portal called "MyChart".  Sign up information is provided on this After Visit Summary.  MyChart is used to connect with patients for Virtual Visits (Telemedicine).  Patients are able to view lab/test results, encounter notes, upcoming appointments, etc.  Non-urgent messages can be sent to your provider as well.   To learn more about what you can do with MyChart, go to [email or web address].    Your next appointment:    4-6 months with Dr. Rennis Golden

## 2022-09-14 NOTE — Progress Notes (Signed)
LIPID CLINIC CONSULT NOTE  Chief Complaint:  Manage dyslipidemia  Primary Care Physician: Renford Dills, MD  Primary Cardiologist:  Bryan Lemma, MD  HPI:  Jacob Carlson is a 42 y.o. male who is being seen today for the evaluation of dyslipidemia at the request of Marykay Lex, MD. this a pleasant 42 year old male unfortunately presented with unstable angina last year and underwent cardiac catheterization in October.  This showed a proximal RCA ulcerated plaque which was stented.  He had a 50% distal LAD lesion as well.  He has a strong family history of heart disease including his father dying when he was 74 months old and his mother who also had an MI at a younger age.  Her brother (his uncle) also had an MI and was considered fairly healthy.  He does have a dyslipidemia with severely elevated triglycerides in the past.  His triglycerides have been as high as 1108.  Most recently had repeat labs which showed total cholesterol 170, triglycerides 436, HDL 24 and LDL 76.  An LP(a) was drawn as well and this was elevated at 118 nmol/L.  He was instructed to increase his rosuvastatin up to 20 mg daily and add 3 g of fish oil.  He has been taking that.  He still notes some intermittent issues with discomfort in the left upper chest particularly when eating certain meals.  He works as a Games developer.  He is less active than he was in the past when he used to mountain bike fairly regularly after he had children.  Diet is variable and he is trying to diet to lose weight as well.  He reports being a former smoker but quit a number of years ago and previously used frequent alcohol but has not had any in the last 8 months.  PMHx:  Past Medical History:  Diagnosis Date   Coronary artery disease involving native heart with angina pectoris 04/06/2022   Cardiac Cath 03/19/2022: Proximal RCA 75% ulcerated plaque (DES PCI Synergy DES 4.0 x 16-4.2 mm).  Distal LAD 50% (not responsive).  EF 55 to  65%.  Normal LVEDP.     Dizziness    Essential hypertension 09/28/2011   GERD (gastroesophageal reflux disease)    Hypercholesterolemia with hypertriglyceridemia 03/04/2012   Severely Elevated TG 1108   Morbid obesity with BMI of 40.0-44.9, adult    Swelling     Past Surgical History:  Procedure Laterality Date   ADENOIDECTOMY     CORONARY STENT INTERVENTION N/A 03/19/2022   Procedure: CORONARY STENT INTERVENTION;  Surgeon: Marykay Lex, MD;  Location: [facility] INVASIVE CV LAB;  Service: Cardiovascular;: Proximal RCA 75% ulcerated plaque (DES PCI Synergy DES 4.0 x 16-4.2 mm).   LEFT HEART CATH AND CORONARY ANGIOGRAPHY N/A 03/19/2022   Procedure: LEFT HEART CATH AND CORONARY ANGIOGRAPHY;  Surgeon: Marykay Lex, MD;  Location: [facility] INVASIVE CV LAB;  Service: Cardiovascular;;: Proximal RCA 75% ulcerated plaque (DES PCI).  Distal LAD 50% (not responsive).  EF 55 to 65%.  Normal LVEDP.   TONSILLECTOMY     TRANSTHORACIC ECHOCARDIOGRAM  03/31/2022   EF 60 to 65%.  Normal LV size and function.  No RWMA.  Mild LVH.  Normal D Fxn.  Mild RV enlargement but unable assess PAP.  Normal MV.  Normal AOV.  Normal RAP.  NORMAL    FAMHx:  Family History  Problem Relation Age of Onset   Coronary artery disease Mother 46 - 34  was non-smoker & otherwise health   Heart attack Mother 355 90- 58       No diabetes   Sudden Cardiac Death Mother    Heart attack Father    Heart block Father    Atrial fibrillation Maternal Aunt 1158       Apparently had complications and issues with her A-fib that eventually caused her death.   Heart attack Maternal Uncle 50 - 56   Sudden Cardiac Death Maternal Uncle 2650 2- 56    SOCHx:   reports that he has quit smoking. His smoking use included cigarettes. He smoked an average of 1 pack per day. His smokeless tobacco use includes chew. He reports current alcohol use. He reports that he does not use drugs.  ALLERGIES:  Allergies  Allergen Reactions   Doxycycline Rash     ROS: Pertinent items noted in HPI and remainder of comprehensive ROS otherwise negative.  HOME MEDS: Current Outpatient Medications on File Prior to Visit  Medication Sig Dispense Refill   aspirin EC 81 MG tablet Take 1 tablet (81 mg total) by mouth daily. Swallow whole. 90 tablet 1   carvedilol (COREG) 25 MG tablet Take 1 tablet (25 mg total) by mouth 2 (two) times daily. 180 tablet 3   chlorthalidone (HYGROTON) 25 MG tablet Take 1 tablet (25 mg total) by mouth daily. 90 tablet 3   clopidogrel (PLAVIX) 75 MG tablet Take 1 tablet (75 mg total) by mouth daily. 90 tablet 3   fenofibrate micronized (LOFIBRA) 134 MG capsule Take 1 capsule (134 mg total) by mouth daily before breakfast. 30 capsule 11   losartan (COZAAR) 50 MG tablet Take 50 mg by mouth 2 (two) times daily.     nitroGLYCERIN (NITROSTAT) 0.4 MG SL tablet Place 1 tablet (0.4 mg total) under the tongue every 5 (five) minutes as needed for chest pain. 25 tablet 5   Omega-3 Fatty Acids (FISH OIL) 1000 MG CAPS Take 1 capsule (1,000 mg total) by mouth 3 (three) times daily. 90 capsule 11   pantoprazole (PROTONIX) 40 MG tablet Take 1 tablet (40 mg total) by mouth daily. 90 tablet 0   rosuvastatin (CRESTOR) 20 MG tablet Take 1 tablet (20 mg total) by mouth daily. 90 tablet 3   No current facility-administered medications on file prior to visit.    LABS/IMAGING: No results found for this or any previous visit (from the past 48 hour(s)). No results found.  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  Mr. Migneault has a mixed dyslipidemia and persistently elevated cholesterol.  He had a recent increase in his rosuvastatin with the addition of OTC fish oil.  He is a prototypical patient for the 2019 REDUCE-IT trial and would likely benefit from prescription Vascepa.  I would like to repeat his cholesterol as it has been about 2 and half months since his meds were adjusted.  Based on that we might switch him over to Vascepa from OTC fish oil.  I would also strongly consider a PCSK9 inhibitor to reach a lower LDL target and potentially give him some benefit with regards to LP(a) lowering.  Finally once he is at target would consider an inflammation evaluation with an HS-CRP.  Thanks again for the kind referral.  Plan follow-up with me in about 4 months.  Chrystie Nose, MD, [facility], FACP  Center  [facility]  Medical Director of the Advanced Lipid Disorders &  Cardiovascular Risk Reduction Clinic Diplomate of the American Board of Clinical Lipidology Attending Cardiologist  Direct Dial: [phone]  Fax: [phone]  Website:  [email or web address] 09/14/2022, 8:51 AM

## 2022-09-15 LAB — NMR, LIPOPROFILE
Cholesterol, Total: 153 mg/dL (ref 100–199)
HDL Particle Number: 27 umol/L — ABNORMAL LOW
HDL-C: 27 mg/dL — ABNORMAL LOW
LDL Particle Number: 1089 nmol/L — ABNORMAL HIGH
LDL Size: 19.4 nm — ABNORMAL LOW
LDL-C (NIH Calc): 75 mg/dL (ref 0–99)
LP-IR Score: 75 — ABNORMAL HIGH
Small LDL Particle Number: 953 nmol/L — ABNORMAL HIGH
Triglycerides: 312 mg/dL — ABNORMAL HIGH (ref 0–149)

## 2022-09-16 ENCOUNTER — Other Ambulatory Visit (HOSPITAL_BASED_OUTPATIENT_CLINIC_OR_DEPARTMENT_OTHER): Payer: Self-pay

## 2022-09-16 ENCOUNTER — Other Ambulatory Visit: Payer: Self-pay | Admitting: Cardiology

## 2022-09-16 MED ORDER — ASPIRIN 81 MG PO TBEC
81.0000 mg | DELAYED_RELEASE_TABLET | Freq: Every day | ORAL | 3 refills | Status: DC
  Filled 2022-09-16: qty 90, 90d supply, fill #0
  Filled 2022-10-30: qty 90, 90d supply, fill #1

## 2022-09-28 NOTE — Telephone Encounter (Signed)
Would add PCSK9 inhibitor - as he remains above goal LDL <55 and has elevated LP(a).  Dr Rexene Edison

## 2022-09-29 MED ORDER — ICOSAPENT ETHYL 1 G PO CAPS: 2.0000 g | ORAL_CAPSULE | Freq: Two times a day (BID) | ORAL | 3 refills | Status: DC

## 2022-09-29 NOTE — Telephone Encounter (Signed)
PA for Repatha [employer] submitted via CMM  Key: [id number]

## 2022-10-01 ENCOUNTER — Encounter (HOSPITAL_BASED_OUTPATIENT_CLINIC_OR_DEPARTMENT_OTHER): Payer: Self-pay | Admitting: Orthopedic Surgery

## 2022-10-01 ENCOUNTER — Other Ambulatory Visit (HOSPITAL_COMMUNITY): Payer: Self-pay

## 2022-10-01 ENCOUNTER — Other Ambulatory Visit: Payer: Self-pay

## 2022-10-01 MED ORDER — ICOSAPENT ETHYL 1 G PO CAPS: 2.0000 g | ORAL_CAPSULE | Freq: Two times a day (BID) | ORAL | 3 refills | Status: DC

## 2022-10-01 NOTE — Progress Notes (Signed)
   10/01/22 1134  PAT Phone Screen  Is the patient taking a GLP-1 receptor agonist? No  Do You Have Diabetes? No  Do You Have Hypertension? Yes  Have You Ever Been to the ER for Asthma? No  Have You Taken Oral Steroids in the Past 3 Months? No  Do you Take Phenteramine or any Other Diet Drugs? No  Recent  Lab Work, EKG, CXR? Yes  Where was this test performed? (S)  EKG NSR 03/19/22  Do you have a history of heart problems? (S)  Yes  Cardiologist Name (S)  Dr Herbie Baltimore- Cards clearance and plavix and asa instructions on the chart. H/O CAD HTN HLD, obesity and GERD  Have you ever had tests on your heart? (S)  Yes  What cardiac tests were performed? (S)  Cardiac Cath;Echo  What date/year were cardiac tests completed? (S)  10/23 DES w/ pRCA   ECHO 03/29/22 EF 60-65%  Results viewable: [facility] Media Tab  Any Recent Hospitalizations? No  Height 6' (1.829 m)  Weight (!) 149.7 kg  [employer] Yes (BMET)

## 2022-10-04 ENCOUNTER — Telehealth: Payer: Self-pay | Admitting: Pharmacist

## 2022-10-04 ENCOUNTER — Other Ambulatory Visit: Payer: Self-pay

## 2022-10-04 ENCOUNTER — Telehealth: Payer: Self-pay

## 2022-10-04 ENCOUNTER — Other Ambulatory Visit (HOSPITAL_COMMUNITY): Payer: Self-pay

## 2022-10-04 MED ORDER — REPATHA SURECLICK 140 MG/ML ~~LOC~~ SOAJ: 140.0000 mg | SUBCUTANEOUS | 3 refills | Status: DC

## 2022-10-04 NOTE — Telephone Encounter (Signed)
Repatha PA approved through 10/01/23

## 2022-10-04 NOTE — Telephone Encounter (Signed)
-----   Message from Carolan Clines, CPhT sent at 10/04/2022  3:02 PM EDT ----- Regarding: RE: vascepa PA needed Carolyne Fiscal,                       A p/a for Vascepa has been submitted to the pts plan and is currently awaiting a determination.   Key: [id number] ----- Message ----- From: Lindell Spar, RN Sent: 09/29/2022   2:39 PM EDT To: Rx Prior Auth Team Subject: vascepa PA needed                              Hello   Dr. Rennis Golden has prescribed Vascepa 2 grams PO BID for this patient for high triglycerides, CAD  He most likely will need a prior auth  Thanks

## 2022-10-04 NOTE — Addendum Note (Signed)
Addended by: Lindell Spar on: 10/04/2022 10:57 AM   Modules accepted: Orders

## 2022-10-07 ENCOUNTER — Other Ambulatory Visit (HOSPITAL_COMMUNITY): Payer: Self-pay

## 2022-10-07 ENCOUNTER — Encounter (HOSPITAL_BASED_OUTPATIENT_CLINIC_OR_DEPARTMENT_OTHER)
Admission: RE | Admit: 2022-10-07 | Discharge: 2022-10-07 | Disposition: A | Discharge status: Home / Self Care | Payer: 59 | Source: Ambulatory Visit | Attending: Orthopedic Surgery | Admitting: Orthopedic Surgery

## 2022-10-07 DIAGNOSIS — Z87891 Personal history of nicotine dependence: Secondary | ICD-10-CM | POA: Diagnosis not present

## 2022-10-07 DIAGNOSIS — Z09 Encounter for follow-up examination after completed treatment for conditions other than malignant neoplasm: Secondary | ICD-10-CM | POA: Diagnosis not present

## 2022-10-07 DIAGNOSIS — M659 Synovitis and tenosynovitis, unspecified: Secondary | ICD-10-CM | POA: Diagnosis not present

## 2022-10-07 DIAGNOSIS — X58XXXA Exposure to other specified factors, initial encounter: Secondary | ICD-10-CM | POA: Diagnosis not present

## 2022-10-07 DIAGNOSIS — K219 Gastro-esophageal reflux disease without esophagitis: Secondary | ICD-10-CM | POA: Diagnosis not present

## 2022-10-07 DIAGNOSIS — I10 Essential (primary) hypertension: Secondary | ICD-10-CM | POA: Diagnosis not present

## 2022-10-07 DIAGNOSIS — I2510 Atherosclerotic heart disease of native coronary artery without angina pectoris: Secondary | ICD-10-CM | POA: Diagnosis not present

## 2022-10-07 DIAGNOSIS — S83241A Other tear of medial meniscus, current injury, right knee, initial encounter: Secondary | ICD-10-CM | POA: Diagnosis not present

## 2022-10-07 LAB — BASIC METABOLIC PANEL
Anion gap: 8 (ref 5–15)
BUN: 21 mg/dL — ABNORMAL HIGH (ref 6–20)
CO2: 24 mmol/L (ref 22–32)
Calcium: 9.4 mg/dL (ref 8.9–10.3)
Chloride: 104 mmol/L (ref 98–111)
Creatinine, Ser: 0.88 mg/dL (ref 0.61–1.24)
GFR, Estimated: >60 mL/min (ref >60)
Glucose, Bld: 114 mg/dL — ABNORMAL HIGH (ref 70–99)
Potassium: 3.7 mmol/L (ref 3.5–5.1)
Sodium: 136 mmol/L (ref 135–145)

## 2022-10-07 NOTE — Telephone Encounter (Signed)
Update:                  No p/a needed after all. After speaking with the pts plan the Brand Name Drug/Vascepa is covered as long as the qty limit per day doesn't go over ''4''. This Rx is currently filled at the pts preferred pharmacy [facility]''

## 2022-10-07 NOTE — Progress Notes (Signed)

## 2022-10-08 ENCOUNTER — Ambulatory Visit (HOSPITAL_BASED_OUTPATIENT_CLINIC_OR_DEPARTMENT_OTHER): Payer: 59 | Admitting: Anesthesiology

## 2022-10-08 ENCOUNTER — Ambulatory Visit (HOSPITAL_BASED_OUTPATIENT_CLINIC_OR_DEPARTMENT_OTHER)
Admission: RE | Admit: 2022-10-08 | Discharge: 2022-10-08 | Disposition: A | Discharge status: Home / Self Care | Payer: 59 | Attending: Orthopedic Surgery | Admitting: Orthopedic Surgery

## 2022-10-08 ENCOUNTER — Encounter (HOSPITAL_BASED_OUTPATIENT_CLINIC_OR_DEPARTMENT_OTHER): Payer: Self-pay | Admitting: Orthopedic Surgery

## 2022-10-08 ENCOUNTER — Other Ambulatory Visit: Payer: Self-pay

## 2022-10-08 ENCOUNTER — Encounter (HOSPITAL_BASED_OUTPATIENT_CLINIC_OR_DEPARTMENT_OTHER)
Admission: RE | Disposition: A | Discharge status: Home / Self Care | Payer: Self-pay | Source: Home / Self Care | Attending: Orthopedic Surgery

## 2022-10-08 DIAGNOSIS — S83241A Other tear of medial meniscus, current injury, right knee, initial encounter: Secondary | ICD-10-CM | POA: Diagnosis not present

## 2022-10-08 DIAGNOSIS — M659 Synovitis and tenosynovitis, unspecified: Secondary | ICD-10-CM | POA: Diagnosis not present

## 2022-10-08 DIAGNOSIS — K219 Gastro-esophageal reflux disease without esophagitis: Secondary | ICD-10-CM | POA: Insufficient documentation

## 2022-10-08 DIAGNOSIS — M94261 Chondromalacia, right knee: Secondary | ICD-10-CM

## 2022-10-08 DIAGNOSIS — X58XXXA Exposure to other specified factors, initial encounter: Secondary | ICD-10-CM | POA: Insufficient documentation

## 2022-10-08 DIAGNOSIS — Z87891 Personal history of nicotine dependence: Secondary | ICD-10-CM | POA: Insufficient documentation

## 2022-10-08 DIAGNOSIS — Z79899 Other long term (current) drug therapy: Secondary | ICD-10-CM

## 2022-10-08 DIAGNOSIS — I10 Essential (primary) hypertension: Secondary | ICD-10-CM | POA: Insufficient documentation

## 2022-10-08 DIAGNOSIS — I25119 Atherosclerotic heart disease of native coronary artery with unspecified angina pectoris: Secondary | ICD-10-CM

## 2022-10-08 DIAGNOSIS — Z09 Encounter for follow-up examination after completed treatment for conditions other than malignant neoplasm: Secondary | ICD-10-CM | POA: Insufficient documentation

## 2022-10-08 DIAGNOSIS — I2510 Atherosclerotic heart disease of native coronary artery without angina pectoris: Secondary | ICD-10-CM | POA: Insufficient documentation

## 2022-10-08 HISTORY — PX: KNEE ARTHROSCOPY WITH MEDIAL MENISECTOMY: SHX5651

## 2022-10-08 SURGERY — ARTHROSCOPY, KNEE, WITH MEDIAL MENISCECTOMY
Anesthesia: General | Site: Knee | Laterality: Right

## 2022-10-08 MED ORDER — PHENYLEPHRINE HCL (PRESSORS) 10 MG/ML IV SOLN
INTRAVENOUS | Status: DC | PRN
  Administered 2022-10-08: 80 ug via INTRAVENOUS

## 2022-10-08 MED ORDER — DEXAMETHASONE SODIUM PHOSPHATE 10 MG/ML IJ SOLN
INTRAMUSCULAR | Status: AC
  Filled 2022-10-08: qty 1

## 2022-10-08 MED ORDER — ONDANSETRON HCL 4 MG/2ML IJ SOLN
INTRAMUSCULAR | Status: AC
  Filled 2022-10-08: qty 2

## 2022-10-08 MED ORDER — PROPOFOL 10 MG/ML IV BOLUS
INTRAVENOUS | Status: DC | PRN
  Administered 2022-10-08: 200 mg via INTRAVENOUS

## 2022-10-08 MED ORDER — PHENYLEPHRINE 80 MCG/ML (10ML) SYRINGE FOR IV PUSH (FOR BLOOD PRESSURE SUPPORT)
PREFILLED_SYRINGE | INTRAVENOUS | Status: AC
  Filled 2022-10-08: qty 10

## 2022-10-08 MED ORDER — CEFAZOLIN SODIUM-DEXTROSE 2-4 GM/100ML-% IV SOLN
2.0000 g | INTRAVENOUS | Status: AC
  Administered 2022-10-08: 2 g via INTRAVENOUS

## 2022-10-08 MED ORDER — ACETAMINOPHEN 500 MG PO TABS: 1000.0000 mg | ORAL_TABLET | Freq: Once | ORAL | Status: DC

## 2022-10-08 MED ORDER — FENTANYL CITRATE (PF) 100 MCG/2ML IJ SOLN
INTRAMUSCULAR | Status: AC
  Filled 2022-10-08: qty 2

## 2022-10-08 MED ORDER — DEXAMETHASONE SODIUM PHOSPHATE 10 MG/ML IJ SOLN
INTRAMUSCULAR | Status: DC | PRN
  Administered 2022-10-08: 8 mg via INTRAVENOUS

## 2022-10-08 MED ORDER — LIDOCAINE 2% (20 MG/ML) 5 ML SYRINGE
INTRAMUSCULAR | Status: AC
  Filled 2022-10-08: qty 5

## 2022-10-08 MED ORDER — ACETAMINOPHEN 10 MG/ML IV SOLN: 1000.0000 mg | Freq: Once | INTRAVENOUS | Status: DC | PRN

## 2022-10-08 MED ORDER — HYDROCODONE-ACETAMINOPHEN 5-325 MG PO TABS: 1.0000 | ORAL_TABLET | ORAL | 0 refills | Status: DC | PRN

## 2022-10-08 MED ORDER — CEFAZOLIN IN SODIUM CHLORIDE 3-0.9 GM/100ML-% IV SOLN
INTRAVENOUS | Status: AC
  Filled 2022-10-08: qty 100

## 2022-10-08 MED ORDER — MIDAZOLAM HCL 2 MG/2ML IJ SOLN
INTRAMUSCULAR | Status: AC
  Filled 2022-10-08: qty 2

## 2022-10-08 MED ORDER — EPINEPHRINE PF 1 MG/ML IJ SOLN
INTRAMUSCULAR | Status: AC
  Filled 2022-10-08: qty 1

## 2022-10-08 MED ORDER — OXYCODONE HCL 5 MG PO TABS: 5.0000 mg | ORAL_TABLET | Freq: Once | ORAL | Status: DC | PRN

## 2022-10-08 MED ORDER — SODIUM CHLORIDE 0.9 % IR SOLN
Status: DC | PRN
  Administered 2022-10-08: 3000 mL

## 2022-10-08 MED ORDER — BUPIVACAINE HCL 0.25 % IJ SOLN
INTRAMUSCULAR | Status: DC | PRN
  Administered 2022-10-08: 13 mL

## 2022-10-08 MED ORDER — FENTANYL CITRATE (PF) 100 MCG/2ML IJ SOLN
INTRAMUSCULAR | Status: DC | PRN
  Administered 2022-10-08: 100 ug via INTRAVENOUS

## 2022-10-08 MED ORDER — LIDOCAINE HCL (CARDIAC) PF 100 MG/5ML IV SOSY
PREFILLED_SYRINGE | INTRAVENOUS | Status: DC | PRN
  Administered 2022-10-08: 60 mg via INTRAVENOUS

## 2022-10-08 MED ORDER — LACTATED RINGERS IV SOLN: INTRAVENOUS | Status: DC

## 2022-10-08 MED ORDER — SUCCINYLCHOLINE CHLORIDE 200 MG/10ML IV SOSY
PREFILLED_SYRINGE | INTRAVENOUS | Status: AC
  Filled 2022-10-08: qty 10

## 2022-10-08 MED ORDER — ONDANSETRON HCL 4 MG/2ML IJ SOLN
INTRAMUSCULAR | Status: DC | PRN
  Administered 2022-10-08: 4 mg via INTRAVENOUS

## 2022-10-08 MED ORDER — PROMETHAZINE HCL 25 MG/ML IJ SOLN: 6.2500 mg | INTRAMUSCULAR | Status: DC | PRN

## 2022-10-08 MED ORDER — ATROPINE SULFATE 0.4 MG/ML IV SOLN
INTRAVENOUS | Status: AC
  Filled 2022-10-08: qty 1

## 2022-10-08 MED ORDER — EPINEPHRINE 1 MG/10ML IJ SOSY
PREFILLED_SYRINGE | INTRAMUSCULAR | Status: DC | PRN
  Administered 2022-10-08: 1 mg

## 2022-10-08 MED ORDER — ONDANSETRON HCL 4 MG PO TABS: 4.0000 mg | ORAL_TABLET | Freq: Three times a day (TID) | ORAL | 0 refills | Status: DC | PRN

## 2022-10-08 MED ORDER — OXYCODONE HCL 5 MG/5ML PO SOLN: 5.0000 mg | Freq: Once | ORAL | Status: DC | PRN

## 2022-10-08 MED ORDER — ACETAMINOPHEN 160 MG/5ML PO SOLN: 325.0000 mg | ORAL | Status: DC | PRN

## 2022-10-08 MED ORDER — EPHEDRINE 5 MG/ML INJ
INTRAVENOUS | Status: AC
  Filled 2022-10-08: qty 5

## 2022-10-08 MED ORDER — FENTANYL CITRATE (PF) 100 MCG/2ML IJ SOLN: 25.0000 ug | INTRAMUSCULAR | Status: DC | PRN

## 2022-10-08 MED ORDER — ACETAMINOPHEN 325 MG PO TABS: 325.0000 mg | ORAL_TABLET | ORAL | Status: DC | PRN

## 2022-10-08 MED ORDER — AMISULPRIDE (ANTIEMETIC) 5 MG/2ML IV SOLN: 10.0000 mg | Freq: Once | INTRAVENOUS | Status: DC | PRN

## 2022-10-08 MED ORDER — MIDAZOLAM HCL 5 MG/5ML IJ SOLN
INTRAMUSCULAR | Status: DC | PRN
  Administered 2022-10-08: 2 mg via INTRAVENOUS

## 2022-10-08 MED ORDER — BUPIVACAINE HCL (PF) 0.25 % IJ SOLN
INTRAMUSCULAR | Status: AC
  Filled 2022-10-08: qty 30

## 2022-10-08 SURGICAL SUPPLY — 35 items
BANDAGE ESMARK 6X9 LF (GAUZE/BANDAGES/DRESSINGS) IMPLANT
BLADE SHAVER TORPEDO 4X13 (MISCELLANEOUS) ×1 IMPLANT
BNDG CMPR 5X62 HK CLSR LF (GAUZE/BANDAGES/DRESSINGS) ×1
BNDG CMPR 9X6 STRL LF SNTH (GAUZE/BANDAGES/DRESSINGS)
BNDG ELASTIC 6INX 5YD STR LF (GAUZE/BANDAGES/DRESSINGS) ×1 IMPLANT
BNDG ESMARK 6X9 LF (GAUZE/BANDAGES/DRESSINGS)
BURR OVAL 8 FLU 4.0X13 (MISCELLANEOUS) IMPLANT
BURR OVAL 8 FLU 5.0X13 (MISCELLANEOUS) IMPLANT
CLSR STERI-STRIP ANTIMIC 1/2X4 (GAUZE/BANDAGES/DRESSINGS) ×1 IMPLANT
CUFF TOURN SGL QUICK 34 (TOURNIQUET CUFF)
CUFF TRNQT CYL 34X4.125X (TOURNIQUET CUFF) IMPLANT
CUTTER BONE 4.0MM X 13CM (MISCELLANEOUS) IMPLANT
DRAPE INCISE IOBAN 66X45 STRL (DRAPES) IMPLANT
DRAPE U-SHAPE 47X51 STRL (DRAPES) ×1 IMPLANT
DRAPE-T ARTHROSCOPY W/POUCH (DRAPES) ×1 IMPLANT
DURAPREP 26ML APPLICATOR (WOUND CARE) ×1 IMPLANT
ELECT REM PT RETURN 9FT ADLT (ELECTROSURGICAL)
ELECTRODE REM PT RTRN 9FT ADLT (ELECTROSURGICAL) IMPLANT
GAUZE PAD ABD 8X10 STRL (GAUZE/BANDAGES/DRESSINGS) ×1 IMPLANT
GAUZE SPONGE 4X4 12PLY STRL (GAUZE/BANDAGES/DRESSINGS) ×1 IMPLANT
GAUZE XEROFORM 1X8 LF (GAUZE/BANDAGES/DRESSINGS) ×1 IMPLANT
GLOVE BIO SURGEON STRL SZ7.5 (GLOVE) ×2 IMPLANT
GLOVE BIOGEL PI IND STRL 8 (GLOVE) ×2 IMPLANT
GOWN STRL REUS W/ TWL XL LVL3 (GOWN DISPOSABLE) ×1 IMPLANT
GOWN STRL REUS W/TWL XL LVL3 (GOWN DISPOSABLE) ×3 IMPLANT
MANIFOLD NEPTUNE II (INSTRUMENTS) ×1 IMPLANT
PACK ARTHROSCOPY DSU (CUSTOM PROCEDURE TRAY) ×1 IMPLANT
PACK BASIN DAY SURGERY FS (CUSTOM PROCEDURE TRAY) ×1 IMPLANT
PORT APPOLLO RF 90DEGREE MULTI (SURGICAL WAND) IMPLANT
SHEET MEDIUM DRAPE 40X70 STRL (DRAPES) ×1 IMPLANT
SUT ETHILON 4 0 PS 2 18 (SUTURE) ×1 IMPLANT
SUT MNCRL AB 3-0 PS2 18 (SUTURE) ×1 IMPLANT
TOWEL GREEN STERILE FF (TOWEL DISPOSABLE) ×1 IMPLANT
TUBE CONNECTING 20X1/4 (TUBING) IMPLANT
TUBING ARTHROSCOPY IRRIG 16FT (MISCELLANEOUS) ×1 IMPLANT

## 2022-10-08 NOTE — Anesthesia Preprocedure Evaluation (Addendum)
Anesthesia Evaluation  Patient identified by MRN, date of birth, ID band Patient awake    Reviewed: Allergy & Precautions, NPO status , Patient's Chart, lab work & pertinent test results  Airway Mallampati: II  TM Distance: >3 FB Neck ROM: Full    Dental  (+) Dental Advisory Given, Teeth Intact   Pulmonary former smoker   breath sounds clear to auscultation       Cardiovascular hypertension, + angina  + CAD   Rhythm:Regular Rate:Normal     Neuro/Psych negative neurological ROS  negative psych ROS   GI/Hepatic Neg liver ROS,GERD  ,,  Endo/Other  negative endocrine ROS    Renal/GU negative Renal ROS     Musculoskeletal negative musculoskeletal ROS (+)    Abdominal   Peds  Hematology negative hematology ROS (+)   Anesthesia Other Findings   Reproductive/Obstetrics                             Anesthesia Physical Anesthesia Plan  ASA: 3  Anesthesia Plan: General   Post-op Pain Management: Tylenol PO (pre-op)* and Toradol IV (intra-op)*   Induction: Intravenous  PONV Risk Score and Plan: 3 and Ondansetron, Dexamethasone and Midazolam  Airway Management Planned: LMA  Additional Equipment: None  Intra-op Plan:   Post-operative Plan: Extubation in OR  Informed Consent: I have reviewed the patients History and Physical, chart, labs and discussed the procedure including the risks, benefits and alternatives for the proposed anesthesia with the patient or authorized representative who has indicated his/her understanding and acceptance.     Dental advisory given  Plan Discussed with: CRNA  Anesthesia Plan Comments:        Anesthesia Quick Evaluation

## 2022-10-08 NOTE — H&P (Signed)
ORTHOPAEDIC H&P  REQUESTING PHYSICIAN: Yolonda Kida, MD  PCP:  Renford Dills, MD  Chief Complaint: Right knee pain  HPI: Jacob Carlson is a 42 y.o. male who complains of right knee pain as well as mechanical symptoms over the last few months.  Here today for arthroscopic partial medial meniscectomy.  No new complaints at this time.  Past Medical History:  Diagnosis Date   Coronary artery disease involving native heart with angina pectoris (HCC) 04/06/2022   Cardiac Cath 03/19/2022: Proximal RCA 75% ulcerated plaque (DES PCI Synergy DES 4.0 x 16-4.2 mm).  Distal LAD 50% (not responsive).  EF 55 to 65%.  Normal LVEDP.     Dizziness    Essential hypertension 09/28/2011   GERD (gastroesophageal reflux disease)    Hypercholesterolemia with hypertriglyceridemia 03/04/2012   Severely Elevated TG 1108   Morbid obesity with BMI of 40.0-44.9, adult (HCC)    Swelling    Past Surgical History:  Procedure Laterality Date   ADENOIDECTOMY     CORONARY STENT INTERVENTION N/A 03/19/2022   Procedure: CORONARY STENT INTERVENTION;  Surgeon: Marykay Lex, MD;  Location: [facility] INVASIVE CV LAB;  Service: Cardiovascular;: Proximal RCA 75% ulcerated plaque (DES PCI Synergy DES 4.0 x 16-4.2 mm).   LEFT HEART CATH AND CORONARY ANGIOGRAPHY N/A 03/19/2022   Procedure: LEFT HEART CATH AND CORONARY ANGIOGRAPHY;  Surgeon: Marykay Lex, MD;  Location: [facility] INVASIVE CV LAB;  Service: Cardiovascular;;: Proximal RCA 75% ulcerated plaque (DES PCI).  Distal LAD 50% (not responsive).  EF 55 to 65%.  Normal LVEDP.   TONSILLECTOMY     TRANSTHORACIC ECHOCARDIOGRAM  03/31/2022   EF 60 to 65%.  Normal LV size and function.  No RWMA.  Mild LVH.  Normal D Fxn.  Mild RV enlargement but unable assess PAP.  Normal MV.  Normal AOV.  Normal RAP.  NORMAL   Social History   Socioeconomic History   Marital status: Married    Spouse name: Not on file   Number of children: Not on file   Years of education: Not on  file   Highest education level: Not on file  Occupational History   Occupation: Heavy diesel truck Curator    Comment: [employer]  Tobacco Use   Smoking status: Former    Packs/day: 1    Types: Cigarettes   Smokeless tobacco: Current    Types: Chew  Vaping Use   Vaping Use: Never used  Substance and Sexual Activity   Alcohol use: Not Currently    Comment: occ   Drug use: No   Sexual activity: Not on file  Other Topics Concern   Not on file  Social History Narrative      Former smoker -age [age]   Heavy EtOH use - no alcohol since Oct 2023      His Sister is a PA - she helps with making sure he takes or uses medications.       Has a physically challenging job, but is not very active.    He has been doing well with weight loss, but over the last year has gotten "out of the habit of doing his exercise and eating right.  He gained back up from 250 pounds to his current 327 pounds.   Social Determinants of Health   Financial Resource Strain: Not on file  Food Insecurity: Not on file  Transportation Needs: Not on file  Physical Activity: Not on file  Stress: Not on file  Social Connections: Not on file   Family History  Problem Relation Age of Onset   Coronary artery disease Mother 29 - 82       was non-smoker & otherwise health   Heart attack Mother 40 - 40       No diabetes   Sudden Cardiac Death Mother    Heart attack Father    Heart block Father    Atrial fibrillation Maternal Aunt 64       Apparently had complications and issues with her A-fib that eventually caused her death.   Heart attack Maternal Uncle 50 11/22/2054   Sudden Cardiac Death Maternal Uncle 50 11-22-54   Allergies  Allergen Reactions   Doxycycline Rash   Prior to Admission medications   Medication Sig Start Date End Date Taking? Authorizing Provider  aspirin EC 81 MG tablet Take 1 tablet (81 mg total) by mouth daily. Swallow whole. 09/16/22  Yes Marykay Lex, MD  carvedilol (COREG) 25  MG tablet Take 1 tablet (25 mg total) by mouth 2 (two) times daily. 04/06/22  Yes Marykay Lex, MD  chlorthalidone (HYGROTON) 25 MG tablet Take 1 tablet (25 mg total) by mouth daily. 03/17/22  Yes Marykay Lex, MD  clopidogrel (PLAVIX) 75 MG tablet Take 1 tablet (75 mg total) by mouth daily. 04/06/22  Yes Marykay Lex, MD  fenofibrate micronized (LOFIBRA) 134 MG capsule Take 1 capsule (134 mg total) by mouth daily before breakfast. 03/17/22 03/12/23 Yes Marykay Lex, MD  icosapent Ethyl (VASCEPA) 1 g capsule Take 2 capsules (2 g total) by mouth 2 (two) times daily. 10/01/22  Yes Hilty, Lisette Abu, MD  losartan (COZAAR) 50 MG tablet Take 50 mg by mouth 2 (two) times daily. 03/08/22  Yes [provider]  pantoprazole (PROTONIX) 40 MG tablet Take 1 tablet (40 mg total) by mouth daily. 07/20/22 07/20/23 Yes Marykay Lex, MD  rosuvastatin (CRESTOR) 20 MG tablet Take 1 tablet (20 mg total) by mouth daily. 07/22/22  Yes Marykay Lex, MD  Evolocumab (REPATHA SURECLICK) 140 MG/ML SOAJ Inject 140 mg into the skin every 14 (fourteen) days. 10/04/22   Hilty, Lisette Abu, MD  nitroGLYCERIN (NITROSTAT) 0.4 MG SL tablet Place 1 tablet (0.4 mg total) under the tongue every 5 (five) minutes as needed for chest pain. 03/17/22   Marykay Lex, MD   No results found.  Positive ROS: All other systems have been reviewed and were otherwise negative with the exception of those mentioned in the HPI and as above.  Physical Exam: General: Alert, no acute distress Cardiovascular: No pedal edema Respiratory: No cyanosis, no use of accessory musculature GI: No organomegaly, abdomen is soft and non-tender Skin: No lesions in the area of chief complaint Neurologic: Sensation intact distally Psychiatric: Patient is competent for consent with normal mood and affect Lymphatic: No axillary or cervical lymphadenopathy  MUSCULOSKELETAL: Right lower extremity is warm and well-perfused with no open wounds  or lesions.  Neurovascular intact.  Assessment: Right knee complex medial meniscus tear.  Plan: Plan to proceed today with arthroscopic right knee partial medial meniscectomy.  We again discussed the risk and benefits of the procedure including but not limited to bleeding, infection, damage to surrounding nerves and vessels, stiffness, DVT, as well as the risk of anesthesia.  Has provided informed consent.  Plan for discharge home postoperatively.    Yolonda Kida, MD Cell [phone]    10/08/2022 7:11 AM

## 2022-10-08 NOTE — Discharge Instructions (Addendum)
Post-operative patient instructions  Knee Arthroscopy   Ice:  Place intermittent ice or cooler pack over your knee, 30 minutes on and 30 minutes off.  Continue this for the first 72 hours after surgery, then save ice for use after therapy sessions or on more active days.   Weight:  You may bear weight on your leg as your symptoms allow. DVT prevention: Perform ankle pumps as able throughout the day on the operative extremity.  Be mobile as possible with ambulation as able.  You should also take an 81 mg aspirin once per day x6 weeks. Crutches:  Use crutches (or walker) to assist in walking until told to discontinue by your physical therapist or physician. This will help to reduce pain. Strengthening:  Perform simple thigh squeezes (isometric quad contractions) and straight leg lifts as you are able (3 sets of 5 to 10 repetitions, 3 times a day).  For the leg lifts, have someone support under your ankle in the beginning until you have increased strength enough to do this on your own.  To help get started on thigh squeezes, place a pillow under your knee and push down on the pillow with back of knee (sometimes easier to do than with your leg fully straight). Motion:  Perform gentle knee motion as tolerated - this is gentle bending and straightening of the knee. Seated heel slides: you can start by sitting in a chair, remove your brace, and gently slide your heel back on the floor - allowing your knee to bend. Have someone help you straighten your knee (or use your other leg/foot hooked under your ankle.  Dressing:  Perform 1st dressing change at 3 days postoperative. A moderate amount of blood tinged drainage is to be expected.  So if you bleed through the dressing on the first or second day or if you have fevers, it is fine to change the dressing/check the wounds early and redress wound. Elevate your leg.  If it bleeds through again, or if the incisions are leaking frank blood, please call the office. May  change dressing every 1-2 days thereafter to help watch wounds. Can purchase Tegderm (or 3M Nexcare) water resistant dressings at local pharmacy / [facility]. Shower:  Light shower is ok after 3 days.  Please take shower, NO bath. Recover with gauze and ace wrap to help keep wounds protected.   Pain medication:  A narcotic pain medication has been prescribed.  Take as directed.  Typically you need narcotic pain medication more regularly during the first 3 to 5 days after surgery.  Decrease your use of the medication as the pain improves.  Narcotics can sometimes cause constipation, even after a few doses.  If you have problems with constipation, you can take an over the counter stool softener or light laxative.  If you have persistent problems, please notify your physician's office. Physical therapy: Additional activity guidelines to be provided by your physician or physical therapist at follow-up visits.  Driving: Do not recommend driving x 1-2 weeks post surgical, especially if surgery performed on right side. Should not drive while taking narcotic pain medications. It typically takes at least 2 weeks to restore sufficient neuromuscular function for normal reaction times for driving safety.  Call [phone] for questions or problems. Evenings you will be forwarded to the hospital operator.  Ask for the orthopaedic physician on call. Please call if you experience:    Redness, foul smelling, or persistent drainage from the surgical site  worsening knee pain and   swelling not responsive to medication  any calf pain and or swelling of the lower leg  temperatures greater than 101.5 F other questions or concerns   Thank you for allowing us to be a part of your care   Post Anesthesia Home Care Instructions  Activity: Get plenty of rest for the remainder of the day. A responsible individual must stay with you for 24 hours following the procedure.  For the next 24 hours, DO NOT: -Drive a car -Operate  machinery -Drink alcoholic beverages -Take any medication unless instructed by your physician -Make any legal decisions or sign important papers.  Meals: Start with liquid foods such as gelatin or soup. Progress to regular foods as tolerated. Avoid greasy, spicy, heavy foods. If nausea and/or vomiting occur, drink only clear liquids until the nausea and/or vomiting subsides. Call your physician if vomiting continues.  Special Instructions/Symptoms: Your throat may feel dry or sore from the anesthesia or the breathing tube placed in your throat during surgery. If this causes discomfort, gargle with warm salt water. The discomfort should disappear within 24 hours.      

## 2022-10-08 NOTE — Op Note (Signed)
Surgery Date: 10/08/2022  Surgeon(s): Yolonda Kida, MD  Assistant: Dion Saucier, PA-C   Assistant attestation:  PA Sharon Seller was present for the entire procedure.  ANESTHESIA:  general, with quarter percent plain Marcaine local  FLUIDS: Per anesthesia record.   ESTIMATED BLOOD LOSS: minimal  PREOPERATIVE DIAGNOSES:  1. Right knee medial meniscus tear 2.  Right knee synovitis  POSTOPERATIVE DIAGNOSES:  1. Right knee medial meniscus tear 2.  Right knee synovitis 3.  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  Right knee arthroscopic partial medial meniscectomy 2.  Right knee arthroscopic chondroplasty medial femoral condyle  DESCRIPTION OF PROCEDURE: [name] is a 42 y.o.-year-old male with right knee complex medial meniscus tear. Plans are to proceed with partial medial meniscectomy and diagnostic arthroscopy with debridement as indicated. Full discussion held regarding risks benefits alternatives and complications related surgical intervention. Conservative care options reviewed. All questions answered.  The patient was identified in the preoperative holding area and the operative extremity was marked. The patient was brought to the operating room and transferred to operating table in a supine position. Satisfactory general anesthesia was induced by anesthesiology.    Standard anterolateral, anteromedial arthroscopy portals were obtained. The anteromedial portal was obtained with a spinal needle for localization under direct visualization with subsequent diagnostic findings.   Anteromedial and anterolateral chambers: mild synovitis. The synovitis was debrided with a 4.5 mm full radius shaver through both the anteromedial and lateral portals.   Suprapatellar pouch and gutters: no synovitis or debris. Patella chondral surface: Grade 0 Trochlear chondral surface: Grade 0 Patellofemoral tracking: Midline and level with no  tilt Medial meniscus: Horizontal tear from the mid body all the way to the posterior horn.  The posterior root was intact.  The tibial side had an unstable flap that was flipped along the posterior tibial rim.  Medial femoral condyle flexion bearing surface: Grade 2 Medial femoral condyle extension bearing surface: Grade 3 Medial tibial plateau: Grade 0 Anterior cruciate ligament:stable Posterior cruciate ligament:stable Lateral meniscus: Intact.   Lateral femoral condyle flexion bearing surface: Grade 0 Lateral femoral condyle extension bearing surface: Grade 0 Lateral tibial plateau: Grade 0  Partial medial meniscectomy was carried out combination of meniscal basket biter as well as motorized shaver.  The horizontal tear was treated with resection of the tibial sided flap tear.  The remnant was stable after completion of the meniscectomy.  After completion of synovectomy, diagnostic exam, and debridements as described, all compartments were checked and no residual debris remained. Hemostasis was achieved with the cautery wand. The portals were approximated with buried monocryl. All excess fluid was expressed from the joint.  Xeroform sterile gauze dressings were applied followed by Ace bandage and ice pack.   DISPOSITION: The patient was awakened from general anesthetic, extubated, taken to the recovery room in medically stable condition, no apparent complications. The patient may be weightbearing as tolerated to the operative lower extremity.  Range of motion of right knee as tolerated.

## 2022-10-08 NOTE — Anesthesia Procedure Notes (Signed)
Procedure Name: LMA Insertion Date/Time: 10/08/2022 7:35 AM  Performed by: Ronnette Hila, CRNAPre-anesthesia Checklist: Patient identified, Emergency Drugs available, Suction available and Patient being monitored Patient Re-evaluated:Patient Re-evaluated prior to induction Oxygen Delivery Method: Circle system utilized Preoxygenation: Pre-oxygenation with 100% oxygen Induction Type: IV induction Ventilation: Mask ventilation without difficulty LMA: LMA inserted LMA Size: 5.0 Number of attempts: 1 Airway Equipment and Method: Bite block Placement Confirmation: positive ETCO2 Tube secured with: Tape Dental Injury: Teeth and Oropharynx as per pre-operative assessment

## 2022-10-08 NOTE — Anesthesia Postprocedure Evaluation (Signed)
Anesthesia Post Note  Patient: NILESH ECKLES  Procedure(s) Performed: KNEE ARTHROSCOPY WITH PARTIAL  MEDIAL MENISECTOMY (Right: Knee)     Patient location during evaluation: PACU Anesthesia Type: General Level of consciousness: awake and alert Pain management: pain level controlled Vital Signs Assessment: post-procedure vital signs reviewed and stable Respiratory status: spontaneous breathing, nonlabored ventilation, respiratory function stable and patient connected to nasal cannula oxygen Cardiovascular status: blood pressure returned to baseline and stable Postop Assessment: no apparent nausea or vomiting Anesthetic complications: no  No notable events documented.  Last Vitals:  Vitals:   10/08/22 0815 10/08/22 0834  BP: (!) 111/57 106/63  Pulse: 95 88  Resp: (!) 23 18  Temp:  (!) 36.1 C  SpO2: 93% 92%    Last Pain:  Vitals:   10/08/22 0834  TempSrc: Temporal  PainSc: 0-No pain                 Shelton Silvas

## 2022-10-08 NOTE — Brief Op Note (Signed)
10/08/2022  7:58 AM  PATIENT:  Jacob Carlson  42 y.o. male  PRE-OPERATIVE DIAGNOSIS:  Right knee medial meniscus tear  POST-OPERATIVE DIAGNOSIS:  Right knee medial meniscus tear  PROCEDURE:  Procedure(s) with comments: KNEE ARTHROSCOPY WITH PARTIAL  MEDIAL MENISECTOMY (Right) - 45  SURGEON:  Surgeon(s) and Role:    Aundria Rud, Noah Delaine, MD - Primary  PHYSICIAN ASSISTANT:   ASSISTANTS: Dion Saucier, PA-C   ANESTHESIA:   local and general  EBL:  5 mL   BLOOD ADMINISTERED:none  DRAINS: none   LOCAL MEDICATIONS USED:  NONE  SPECIMEN:  No Specimen  DISPOSITION OF SPECIMEN:  N/A  COUNTS:  YES  TOURNIQUET:  * No tourniquets in log *  DICTATION: .Note written in EPIC  PLAN OF CARE: Discharge to home after PACU  PATIENT DISPOSITION:  PACU - hemodynamically stable.   Delay start of Pharmacological VTE agent (>24hrs) due to surgical blood loss or risk of bleeding: not applicable

## 2022-10-08 NOTE — Transfer of Care (Signed)
Immediate Anesthesia Transfer of Care Note  Patient: Jacob Carlson  Procedure(s) Performed: KNEE ARTHROSCOPY WITH PARTIAL  MEDIAL MENISECTOMY (Right: Knee)  Patient Location: PACU  Anesthesia Type:General  Level of Consciousness: awake, alert , oriented, drowsy, and patient cooperative  Airway & Oxygen Therapy: Patient Spontanous Breathing and Patient connected to face mask oxygen  Post-op Assessment: Report given to RN and Post -op Vital signs reviewed and stable  Post vital signs: Reviewed and stable  Last Vitals:  Vitals Value Taken Time  BP    Temp    Pulse 98 10/08/22 0807  Resp    SpO2 92 % 10/08/22 0807  Vitals shown include unvalidated device data.  Last Pain:  Vitals:   10/08/22 0630  TempSrc: Oral  PainSc: 0-No pain      Patients Stated Pain Goal: 3 (10/08/22 0630)  Complications: No notable events documented.

## 2022-10-11 ENCOUNTER — Encounter (HOSPITAL_BASED_OUTPATIENT_CLINIC_OR_DEPARTMENT_OTHER): Payer: Self-pay | Admitting: Orthopedic Surgery

## 2022-10-26 MED ORDER — REPATHA SURECLICK 140 MG/ML ~~LOC~~ SOAJ: 140.0000 mg | SUBCUTANEOUS | 11 refills | Status: DC

## 2022-10-26 NOTE — Addendum Note (Signed)
Addended by: Lindell Spar on: 10/26/2022 09:20 AM   Modules accepted: Orders

## 2022-10-30 ENCOUNTER — Other Ambulatory Visit: Payer: Self-pay | Admitting: Cardiology

## 2022-10-31 ENCOUNTER — Other Ambulatory Visit (HOSPITAL_BASED_OUTPATIENT_CLINIC_OR_DEPARTMENT_OTHER): Payer: Self-pay

## 2022-11-02 ENCOUNTER — Other Ambulatory Visit (HOSPITAL_BASED_OUTPATIENT_CLINIC_OR_DEPARTMENT_OTHER): Payer: Self-pay

## 2022-11-02 MED ORDER — PANTOPRAZOLE SODIUM 40 MG PO TBEC
40.0000 mg | DELAYED_RELEASE_TABLET | Freq: Every day | ORAL | 1 refills | Status: DC
  Filled 2022-11-02: qty 30, 30d supply, fill #0
  Filled 2022-12-02: qty 30, 30d supply, fill #1
  Filled 2023-01-03: qty 30, 30d supply, fill #2
  Filled 2023-02-01 (×2): qty 30, 30d supply, fill #3
  Filled 2023-02-22 – 2023-03-07 (×3): qty 30, 30d supply, fill #4
  Filled 2023-04-04: qty 30, 30d supply, fill #5

## 2022-12-02 ENCOUNTER — Other Ambulatory Visit: Payer: Self-pay

## 2023-01-03 ENCOUNTER — Telehealth: Payer: Self-pay | Admitting: Cardiology

## 2023-01-03 ENCOUNTER — Other Ambulatory Visit: Payer: Self-pay

## 2023-01-03 MED ORDER — CARVEDILOL 25 MG PO TABS: 25.0000 mg | ORAL_TABLET | Freq: Two times a day (BID) | ORAL | 0 refills | Status: DC

## 2023-01-03 MED ORDER — LOSARTAN POTASSIUM 50 MG PO TABS: 50.0000 mg | ORAL_TABLET | Freq: Two times a day (BID) | ORAL | 0 refills | Status: DC

## 2023-01-03 MED ORDER — FENOFIBRATE 134 MG PO CAPS: 134.0000 mg | ORAL_CAPSULE | Freq: Every day | ORAL | 0 refills | Status: DC

## 2023-01-03 MED ORDER — CHLORTHALIDONE 25 MG PO TABS: 25.0000 mg | ORAL_TABLET | Freq: Every day | ORAL | 0 refills | Status: DC

## 2023-01-03 NOTE — Telephone Encounter (Signed)
Spoke with Mr Pasley, informed him that he needs to schedule a follow-up visit with Dr. Herbie Baltimore. I will send a message to scheduling as well to ensure he get the office visit needed.

## 2023-01-03 NOTE — Telephone Encounter (Signed)
*  STAT* If patient is at the pharmacy, call can be transferred to refill team.   1. Which medications need to be refilled? (please list name of each medication and dose if known)   carvedilol (COREG) 25 MG tablet  chlorthalidone (HYGROTON) 25 MG tablet   fenofibrate micronized (LOFIBRA) 134 MG capsule    losartan (COZAAR) 50 MG tablet   2. Would you like to learn more about the convenience, safety, & potential cost savings by using the [facility]? No   3. Are you open to using the [facility] (Type [facility]. )No   4. Which pharmacy/location (including street and city if local pharmacy) is medication to be sent to?[id number] - [place], [place] - [address]    5. Do they need a 30 day or 90 day supply? 30 day   Pt has only one dose of medications left

## 2023-01-16 NOTE — Progress Notes (Unsigned)
Cardiology Clinic Note   Patient Name: Jacob Carlson Date of Encounter: 01/18/2023  Primary Care Provider:  Renford Dills, MD Primary Cardiologist:  Jacob Lemma, MD  Patient Profile    42 year old male with history of coronary artery disease, cardiac catheterization revealing single-vessel CAD with proximal RCA ulcerated plaque treated with drug-eluting stent (4.0 x 16 mm) per cardiac catheterization report 03/19/2022, on DAPT, but to stop aspirin April 2024.  Also history of mixed hyperlipidemia, hypertension, morbid obesity.  He is also followed by Dr. Rennis Carlson in the lipid clinic.  Last seen by him on 09/14/2022 with repeat lipids and LFTs ordered.  He was started on Repatha on 09/28/2022.  LP(a) 118.  Past Medical History    Past Medical History:  Diagnosis Date   Coronary artery disease involving native heart with angina pectoris (HCC) 04/06/2022   Cardiac Cath 03/19/2022: Proximal RCA 75% ulcerated plaque (DES PCI Synergy DES 4.0 x 16-4.2 mm).  Distal LAD 50% (not responsive).  EF 55 to 65%.  Normal LVEDP.     Dizziness    Essential hypertension 09/28/2011   GERD (gastroesophageal reflux disease)    Hypercholesterolemia with hypertriglyceridemia 03/04/2012   Severely Elevated TG 1108   Morbid obesity with BMI of 40.0-44.9, adult (HCC)    Swelling    Past Surgical History:  Procedure Laterality Date   ADENOIDECTOMY     CORONARY STENT INTERVENTION N/A 03/19/2022   Procedure: CORONARY STENT INTERVENTION;  Surgeon: Jacob Lex, MD;  Location: [facility] INVASIVE CV LAB;  Service: Cardiovascular;: Proximal RCA 75% ulcerated plaque (DES PCI Synergy DES 4.0 x 16-4.2 mm).   KNEE ARTHROSCOPY WITH MEDIAL MENISECTOMY Right 10/08/2022   Procedure: KNEE ARTHROSCOPY WITH PARTIAL  MEDIAL MENISECTOMY;  Surgeon: Jacob Kida, MD;  Location: [facility];  Service: Orthopedics;  Laterality: Right;  45   LEFT HEART CATH AND CORONARY ANGIOGRAPHY N/A 03/19/2022   Procedure: LEFT  HEART CATH AND CORONARY ANGIOGRAPHY;  Surgeon: Jacob Lex, MD;  Location: [facility] INVASIVE CV LAB;  Service: Cardiovascular;;: Proximal RCA 75% ulcerated plaque (DES PCI).  Distal LAD 50% (not responsive).  EF 55 to 65%.  Normal LVEDP.   TONSILLECTOMY     TRANSTHORACIC ECHOCARDIOGRAM  03/31/2022   EF 60 to 65%.  Normal LV size and function.  No RWMA.  Mild LVH.  Normal D Fxn.  Mild RV enlargement but unable assess PAP.  Normal MV.  Normal AOV.  Normal RAP.  NORMAL    Allergies  Allergies  Allergen Reactions   Doxycycline Rash    History of Present Illness    Mr. Cousin returns to the office today for ongoing assessment and management of coronary artery disease, history of DES to the right coronary artery in 03/19/2022, hypertension, hyperlipidemia on PCSK9 inhibition followed by Dr. Rennis Carlson.  He is here for medication refills.  Aspirin should be discontinued on this office visit.   He comes today with complaints of having some chest pressure, usually when he is eating, he states when he loosens his belt it feels better.  He does have some tingling in his left arm on occasion.  He also notices it when he eats salty foods.  He does not take his blood pressure during those times.  He is very active at work lifting heavy things and walking a lot.  When he returns home from work he is exhausted.  He has also had recent arthroscopy of his right knee.  He continues to have significant amount of  soreness which is limiting his exercise capacity.  He continues to have mild dyspnea on exertion, denies any significant chest pain, dizziness, palpitations, or fluid retention.  She has been medically compliant. Home Medications    Current Outpatient Medications  Medication Sig Dispense Refill   chlorthalidone (HYGROTON) 25 MG tablet Take 1 tablet (25 mg total) by mouth daily. 90 tablet 0   fenofibrate micronized (LOFIBRA) 134 MG capsule Take 1 capsule (134 mg total) by mouth daily before breakfast. 90  capsule 0   ibuprofen (ADVIL) 200 MG tablet Take 800 mg by mouth 2 (two) times daily as needed for moderate pain.     icosapent Ethyl (VASCEPA) 1 g capsule Take 2 capsules (2 g total) by mouth 2 (two) times daily. 360 capsule 3   nitroGLYCERIN (NITROSTAT) 0.4 MG SL tablet Place 1 tablet (0.4 mg total) under the tongue every 5 (five) minutes as needed for chest pain. 25 tablet 5   pantoprazole (PROTONIX) 40 MG tablet Take 1 tablet (40 mg total) by mouth daily. 90 tablet 1   carvedilol (COREG) 25 MG tablet Take 1 tablet (25 mg total) by mouth 2 (two) times daily. 60 tablet 6   clopidogrel (PLAVIX) 75 MG tablet Take 1 tablet (75 mg total) by mouth daily. 30 tablet 6   Evolocumab (REPATHA SURECLICK) 140 MG/ML SOAJ Inject 140 mg into the skin every 14 (fourteen) days. 2 mL 11   losartan (COZAAR) 50 MG tablet Take 1 tablet (50 mg total) by mouth 2 (two) times daily. 30 tablet 6   rosuvastatin (CRESTOR) 20 MG tablet Take 1 tablet (20 mg total) by mouth daily. 30 tablet 6   No current facility-administered medications for this visit.     Family History    Family History  Problem Relation Age of Onset   Coronary artery disease Mother 52 - 50       was non-smoker & otherwise health   Heart attack Mother 32 - 29       No diabetes   Sudden Cardiac Death Mother    Heart attack Father    Heart block Father    Atrial fibrillation Maternal Aunt 48       Apparently had complications and issues with her A-fib that eventually caused her death.   Heart attack Maternal Uncle 50 01/24/2055   Sudden Cardiac Death Maternal Uncle 71 January 24, 2055   He indicated that his mother is deceased. He indicated that his father is deceased. He indicated that his maternal aunt is deceased. He indicated that his maternal uncle is deceased.  Social History    Social History   Socioeconomic History   Marital status: Married    Spouse name: Not on file   Number of children: Not on file   Years of education: Not on file   Highest  education level: Not on file  Occupational History   Occupation: Heavy diesel truck Curator    Comment: [employer]  Tobacco Use   Smoking status: Former    Current packs/day: 1.00    Types: Cigarettes   Smokeless tobacco: Current    Types: Chew  Vaping Use   Vaping status: Never Used  Substance and Sexual Activity   Alcohol use: Not Currently    Comment: occ   Drug use: No   Sexual activity: Not on file  Other Topics Concern   Not on file  Social History Narrative      Former smoker -age [age]   Heavy  EtOH use - no alcohol since Oct 2023      His Sister is a PA - she helps with making sure he takes or uses medications.       Has a physically challenging job, but is not very active.    He has been doing well with weight loss, but over the last year has gotten "out of the habit of doing his exercise and eating right.  He gained back up from 250 pounds to his current 327 pounds.   Social Determinants of Health   Financial Resource Strain: Not on file  Food Insecurity: Not on file  Transportation Needs: Not on file  Physical Activity: Not on file  Stress: Not on file  Social Connections: Not on file  Intimate Partner Violence: Not on file     Review of Systems    General:  No chills, fever, night sweats or weight changes.  Cardiovascular:  No chest pain, dyspnea on exertion, edema, orthopnea, palpitations, paroxysmal nocturnal dyspnea. Dermatological: No rash, lesions/masses Respiratory: No cough, dyspnea Urologic: No hematuria, dysuria Abdominal:   No nausea, vomiting, diarrhea, bright red blood per rectum, melena, or hematemesis Neurologic:  No visual changes, wkns, changes in mental status. All other systems reviewed and are otherwise negative except as noted above.  EKG Interpretation Date/Time:  Tuesday January 18 2023 08:31:21 EDT Ventricular Rate:  88 PR Interval:  142 QRS Duration:  96 QT Interval:  366 QTC Calculation: 442 R  Axis:   39  Text Interpretation: Normal sinus rhythm Normal ECG When compared with ECG of 19-Mar-2022 12:40, No significant change was found Confirmed by Joni Reining [phone]) on 01/18/2023 10:50:49 AM    Physical Exam    VS:  BP 118/66 (BP Location: Left Arm, Patient Position: Sitting, Cuff Size: Large)   Pulse 90   Ht 6' (1.829 m)   Wt (!) 342 lb 6.4 oz (155.3 kg)   SpO2 95%   BMI 46.44 kg/m  , BMI Body mass index is 46.44 kg/m. STOP-Bang Score:  7      GEN: Well nourished, well developed, in no acute distress.  Obese HEENT: normal.  Large circumference Neck: Supple, no JVD, carotid bruits, or masses. Cardiac: RRR, no murmurs, rubs, or gallops. No clubbing, cyanosis, edema.  Radials/DP/PT 2+ and equal bilaterally.  Respiratory:  Respirations regular and unlabored, clear to auscultation bilaterally. GI: Soft, nontender, nondistended, BS + x 4. MS: no deformity or atrophy. Skin: warm and dry, no rash. Neuro:  Strength and sensation are intact. Psych: Normal affect.  EKG Interpretation Date/Time:  Tuesday January 18 2023 08:31:21 EDT Ventricular Rate:  88 PR Interval:  142 QRS Duration:  96 QT Interval:  366 QTC Calculation: 442 R Axis:   39  Text Interpretation: Normal sinus rhythm Normal ECG When compared with ECG of 19-Mar-2022 12:40, No significant change was found Confirmed by Joni Reining [phone]) on 01/18/2023 10:50:49 AM   Lab Results  Component Value Date   WBC 7.2 03/17/2022   HGB 16.1 03/17/2022   HCT 46.9 03/17/2022   MCV 86 03/17/2022   PLT 150 03/17/2022   Lab Results  Component Value Date   CREATININE 0.88 10/07/2022   BUN 21 (H) 10/07/2022   NA 136 10/07/2022   K 3.7 10/07/2022   CL 104 10/07/2022   CO2 24 10/07/2022   No results found for: "ALT", "AST", "GGT", "ALKPHOS", "BILITOT" Lab Results  Component Value Date   CHOL 170 07/20/2022   HDL 24 (L) 07/20/2022  LDLCALC 76 07/20/2022   TRIG 436 (H) 07/20/2022   CHOLHDL 7.1 (H)  07/20/2022    No results found for: "HGBA1C"  STOP BANG Score 7  Review of Prior Studies [facility]- 03/20/2023 Dr.Harding  EKG Interpretation Date/Time:  Tuesday January 18 2023 08:31:21 EDT Ventricular Rate:  88 PR Interval:  142 QRS Duration:  96 QT Interval:  366 QTC Calculation: 442 R Axis:   39  Text Interpretation: Normal sinus rhythm Normal ECG When compared with ECG of 19-Mar-2022 12:40, No significant change was found Confirmed by Joni Reining [phone]) on 01/18/2023 10:50:49 AM  Prox RCA lesion is 75% stenosed. ->  Ulcerated eccentric plaque.   A drug-eluting stent was successfully placed using a SYNERGY XD 4.0X16 -> postdilated to 4.2 mm   Post intervention, there is a 0% residual stenosis.   Dist LAD lesion is 50% stenosed.  NTG responsive spasm.   The left ventricular systolic function is normal. The left ventricular ejection fraction is 55-65% by visual estimate.   LV end diastolic pressure is normal.   There is no aortic valve stenosis.  Diagnostic Dominance: Right  Intervention   Echocardiogram 04/01/2023  1. Left ventricular ejection fraction, by estimation, is 60 to 65%. The  left ventricle has normal function. The left ventricle has no regional  wall motion abnormalities. There is mild left ventricular hypertrophy.  Left ventricular diastolic parameters  were normal.   2. Right ventricular systolic function is normal. The right ventricular  size is mildly enlarged. Tricuspid regurgitation signal is inadequate for  assessing PA pressure.   3. The mitral valve is normal in structure. Trivial mitral valve  regurgitation.   4. The aortic valve is tricuspid. There is mild thickening of the aortic  valve. Aortic valve regurgitation is not visualized.   5. The inferior vena cava is normal in size with greater than 50%  respiratory variability, suggesting right atrial pressure of 3 mmHg.    Assessment & Plan   1.  Coronary artery disease: Single-vessel CAD with  proximal RCA ulcerated plaque treated with drug-eluting stent on 03/19/2022 on DAPT.  He may stop aspirin today but continue Plavix until October 2024.  But can go longer until next follow-up.  He denies any chest pain but does feel some fullness in his chest especially when he is eating or when he is had something salty.  He is to avoid salt eat slower and be mindful of his diet.  Secondary prevention with blood pressure control, lipid management, weight loss, purposeful exercise. CMET and CBC are being drawn today.  2.  Hypercholesterolemia: Goal of LDL less than 70.  The patient will have lipids drawn this morning as he is fasting.  He remains on rosuvastatin 20 mg daily.  3.  Hypertension: Blood pressures currently well-controlled on 3 antihypertensives.  He is to avoid salt lose weight, and be more physically active to assist with management.  4.  OSA: STOP-BANG score 7.  He will be scheduled for a home sleep study to evaluate further.  He is exhausted when he wakes up in the morning and his wife states he is snoring.  He does have some shortness of breath during the day.  He is very tired after work.  This may be multifactorial due to obesity and limited exercise capacity.  Will evaluate further which may allow his blood pressure medications to be titrated down or eliminated as blood pressure may normalize with use of CPAP.  5.  Morbid obesity: On next  office visit may refer him to healthy weight and wellness through [facility] health.         Signed, Bettey Mare. Liborio Nixon, ANP, AACC   01/18/2023 10:51 AM      Office [phone] Fax [phone]  Notice: This dictation was prepared with Dragon dictation along with smaller phrase technology. Any transcriptional errors that result from this process are unintentional and may not be corrected upon review.

## 2023-01-18 ENCOUNTER — Ambulatory Visit: Payer: 59 | Attending: Adult Health | Admitting: Adult Health

## 2023-01-18 ENCOUNTER — Encounter: Payer: Self-pay | Admitting: Adult Health

## 2023-01-18 VITALS — BP 118/66 | HR 90 | Ht 72.0 in | Wt 342.4 lb

## 2023-01-18 DIAGNOSIS — R0609 Other forms of dyspnea: Secondary | ICD-10-CM | POA: Diagnosis not present

## 2023-01-18 DIAGNOSIS — I10 Essential (primary) hypertension: Secondary | ICD-10-CM

## 2023-01-18 DIAGNOSIS — E7800 Pure hypercholesterolemia, unspecified: Secondary | ICD-10-CM

## 2023-01-18 DIAGNOSIS — G4733 Obstructive sleep apnea (adult) (pediatric): Secondary | ICD-10-CM

## 2023-01-18 DIAGNOSIS — I25119 Atherosclerotic heart disease of native coronary artery with unspecified angina pectoris: Secondary | ICD-10-CM

## 2023-01-18 DIAGNOSIS — Z6841 Body Mass Index (BMI) 40.0 and over, adult: Secondary | ICD-10-CM

## 2023-01-18 LAB — COMPREHENSIVE METABOLIC PANEL
ALT: 30 IU/L (ref 0–44)
AST: 22 IU/L (ref 0–40)
Albumin: 4.7 g/dL (ref 4.1–5.1)
Alkaline Phosphatase: 88 IU/L (ref 44–121)
BUN/Creatinine Ratio: 26 — ABNORMAL HIGH (ref 9–20)
BUN: 22 mg/dL (ref 6–24)
Bilirubin Total: 0.6 mg/dL (ref 0.0–1.2)
CO2: 26 mmol/L (ref 20–29)
Calcium: 9.5 mg/dL (ref 8.7–10.2)
Chloride: 103 mmol/L (ref 96–106)
Creatinine, Ser: 0.85 mg/dL (ref 0.76–1.27)
Globulin, Total: 1.8 g/dL (ref 1.5–4.5)
Glucose: 134 mg/dL — ABNORMAL HIGH (ref 70–99)
Potassium: 3.7 mmol/L (ref 3.5–5.2)
Sodium: 142 mmol/L (ref 134–144)
Total Protein: 6.5 g/dL (ref 6.0–8.5)
eGFR: 112 mL/min/{1.73_m2} (ref >59)

## 2023-01-18 LAB — LIPID PANEL
Chol/HDL Ratio: 4.7 ratio (ref 0.0–5.0)
Cholesterol, Total: 118 mg/dL (ref 100–199)
HDL: 25 mg/dL — ABNORMAL LOW (ref >39)
LDL Chol Calc (NIH): 50 mg/dL (ref 0–99)
Triglycerides: 278 mg/dL — ABNORMAL HIGH (ref 0–149)
VLDL Cholesterol Cal: 43 mg/dL — ABNORMAL HIGH (ref 5–40)

## 2023-01-18 MED ORDER — LOSARTAN POTASSIUM 50 MG PO TABS: 50.0000 mg | ORAL_TABLET | Freq: Two times a day (BID) | ORAL | 6 refills | Status: DC

## 2023-01-18 MED ORDER — CARVEDILOL 25 MG PO TABS: 25.0000 mg | ORAL_TABLET | Freq: Two times a day (BID) | ORAL | 6 refills | Status: DC

## 2023-01-18 MED ORDER — ROSUVASTATIN CALCIUM 20 MG PO TABS: 20.0000 mg | ORAL_TABLET | Freq: Every day | ORAL | 6 refills | Status: DC

## 2023-01-18 MED ORDER — CLOPIDOGREL BISULFATE 75 MG PO TABS: 75.0000 mg | ORAL_TABLET | Freq: Every day | ORAL | 6 refills | Status: AC

## 2023-01-18 MED ORDER — REPATHA SURECLICK 140 MG/ML ~~LOC~~ SOAJ: 140.0000 mg | SUBCUTANEOUS | 11 refills | Status: DC

## 2023-01-18 NOTE — Patient Instructions (Signed)
Medication Instructions:  No Changes *If you need a refill on your cardiac medications before your next appointment, please call your pharmacy*   Lab Work: CMET,CBC, Lipid Panel Today If you have labs (blood work) drawn today and your tests are completely normal, you will receive your results only by: MyChart Message (if you have MyChart) OR A paper copy in the mail If you have any lab test that is abnormal or we need to change your treatment, we will call you to review the results.   Testing/Procedures: [facility]. Your physician has recommended that you have a sleep study. This test records several body functions during sleep, including: brain activity, eye movement, oxygen and carbon dioxide blood levels, heart rate and rhythm, breathing rate and rhythm, the flow of air through your mouth and nose, snoring, body muscle movements, and chest and belly movement.    Follow-Up: At [facility], you and your health needs are our priority.  As part of our continuing mission to provide you with exceptional heart care, we have created designated Provider Care Teams.  These Care Teams include your primary Cardiologist (physician) and Advanced Practice Providers (APPs -  Physician Assistants and Nurse Practitioners) who all work together to provide you with the care you need, when you need it.  We recommend signing up for the patient portal called "MyChart".  Sign up information is provided on this After Visit Summary.  MyChart is used to connect with patients for Virtual Visits (Telemedicine).  Patients are able to view lab/test results, encounter notes, upcoming appointments, etc.  Non-urgent messages can be sent to your provider as well.   To learn more about what you can do with MyChart, go to [email or web address].    Your next appointment:   6 month(s)  Provider:   Bryan Lemma, MD

## 2023-01-19 ENCOUNTER — Telehealth: Payer: Self-pay

## 2023-01-19 NOTE — Telephone Encounter (Addendum)
Results viewed by patient via Mychart.----- Message from Joni Reining sent at 01/18/2023  5:43 PM EDT ----- I have reviewed his labs. Triglycerides are very elevated. This was done fasting. He should follow up with PCP for management. Compared to previous labs, triglycerides are improved.   Total cholesterol and LDL (bad cholesterol) are well controlled. Liver enzymes are normal No changes in regimen,

## 2023-01-19 NOTE — Telephone Encounter (Signed)
Results viewed by patient via Mychart.

## 2023-02-01 ENCOUNTER — Encounter (HOSPITAL_BASED_OUTPATIENT_CLINIC_OR_DEPARTMENT_OTHER): Payer: Self-pay | Admitting: Internal Medicine

## 2023-02-01 ENCOUNTER — Other Ambulatory Visit (HOSPITAL_BASED_OUTPATIENT_CLINIC_OR_DEPARTMENT_OTHER): Payer: Self-pay

## 2023-02-01 ENCOUNTER — Ambulatory Visit (INDEPENDENT_AMBULATORY_CARE_PROVIDER_SITE_OTHER): Payer: 59 | Admitting: Internal Medicine

## 2023-02-01 VITALS — BP 128/82 | HR 79 | Ht 72.0 in | Wt 343.7 lb

## 2023-02-01 DIAGNOSIS — E785 Hyperlipidemia, unspecified: Secondary | ICD-10-CM | POA: Diagnosis not present

## 2023-02-01 DIAGNOSIS — E7841 Elevated Lipoprotein(a): Secondary | ICD-10-CM

## 2023-02-01 DIAGNOSIS — I25119 Atherosclerotic heart disease of native coronary artery with unspecified angina pectoris: Secondary | ICD-10-CM

## 2023-02-01 DIAGNOSIS — E781 Pure hyperglyceridemia: Secondary | ICD-10-CM | POA: Diagnosis not present

## 2023-02-01 NOTE — Progress Notes (Signed)
LIPID CLINIC CONSULT NOTE  Chief Complaint:  Follow-up dyslipidemia  Primary Care Physician: Renford Dills, MD  Primary Cardiologist:  Bryan Lemma, MD  HPI:  Jacob Carlson is a 42 y.o. male who is being seen today for the evaluation of dyslipidemia at the request of Renford Dills, MD. this a pleasant 42 year old male unfortunately presented with unstable angina last year and underwent cardiac catheterization in October.  This showed a proximal RCA ulcerated plaque which was stented.  He had a 50% distal LAD lesion as well.  He has a strong family history of heart disease including his father dying when he was 26 months old and his mother who also had an MI at a younger age.  Her brother (his uncle) also had an MI and was considered fairly healthy.  He does have a dyslipidemia with severely elevated triglycerides in the past.  His triglycerides have been as high as 1108.  Most recently had repeat labs which showed total cholesterol 170, triglycerides 436, HDL 24 and LDL 76.  An LP(a) was drawn as well and this was elevated at 118 nmol/L.  He was instructed to increase his rosuvastatin up to 20 mg daily and add 3 g of fish oil.  He has been taking that.  He still notes some intermittent issues with discomfort in the left upper chest particularly when eating certain meals.  He works as a Games developer.  He is less active than he was in the past when he used to mountain bike fairly regularly after he had children.  Diet is variable and he is trying to diet to lose weight as well.  He reports being a former smoker but quit a number of years ago and previously used frequent alcohol but has not had any in the last 8 months.  02/01/2023  [name] returns today for follow-up of his dyslipidemia.  Overall he seems to be doing well.  He recently saw Lorin Picket, DNP who had made some other additional cardiac recommendations.  He is tolerating atorvastatin without issues.  He was supposed to  have repeat NMR and LP(a) but had a regular lipid 2 weeks ago.  This showed total cholesterol 118, triglycerides 278 (down from as high as 436-6 months ago), HDL 25 and LDL 50.  He is now at target on combination therapy with the addition of Repatha.  He will reach 1 year since stenting in October.  PMHx:  Past Medical History:  Diagnosis Date   Coronary artery disease involving native heart with angina pectoris (HCC) 04/06/2022   Cardiac Cath 03/19/2022: Proximal RCA 75% ulcerated plaque (DES PCI Synergy DES 4.0 x 16-4.2 mm).  Distal LAD 50% (not responsive).  EF 55 to 65%.  Normal LVEDP.     Dizziness    Essential hypertension 09/28/2011   GERD (gastroesophageal reflux disease)    Hypercholesterolemia with hypertriglyceridemia 03/04/2012   Severely Elevated TG 1108   Morbid obesity with BMI of 40.0-44.9, adult (HCC)    Swelling     Past Surgical History:  Procedure Laterality Date   ADENOIDECTOMY     CORONARY STENT INTERVENTION N/A 03/19/2022   Procedure: CORONARY STENT INTERVENTION;  Surgeon: Marykay Lex, MD;  Location: [facility] INVASIVE CV LAB;  Service: Cardiovascular;: Proximal RCA 75% ulcerated plaque (DES PCI Synergy DES 4.0 x 16-4.2 mm).   KNEE ARTHROSCOPY WITH MEDIAL MENISECTOMY Right 10/08/2022   Procedure: KNEE ARTHROSCOPY WITH PARTIAL  MEDIAL MENISECTOMY;  Surgeon: Yolonda Kida, MD;  Location:  [facility];  Service: Orthopedics;  Laterality: Right;  45   LEFT HEART CATH AND CORONARY ANGIOGRAPHY N/A 03/19/2022   Procedure: LEFT HEART CATH AND CORONARY ANGIOGRAPHY;  Surgeon: Marykay Lex, MD;  Location: [facility] INVASIVE CV LAB;  Service: Cardiovascular;;: Proximal RCA 75% ulcerated plaque (DES PCI).  Distal LAD 50% (not responsive).  EF 55 to 65%.  Normal LVEDP.   TONSILLECTOMY     TRANSTHORACIC ECHOCARDIOGRAM  03/31/2022   EF 60 to 65%.  Normal LV size and function.  No RWMA.  Mild LVH.  Normal D Fxn.  Mild RV enlargement but unable assess PAP.  Normal MV.   Normal AOV.  Normal RAP.  NORMAL    FAMHx:  Family History  Problem Relation Age of Onset   Coronary artery disease Mother 11 - 53       was non-smoker & otherwise health   Heart attack Mother 38 - 86       No diabetes   Sudden Cardiac Death Mother    Heart attack Father    Heart block Father    Atrial fibrillation Maternal Aunt 12       Apparently had complications and issues with her A-fib that eventually caused her death.   Heart attack Maternal Uncle 50 Feb 24, 2055   Sudden Cardiac Death Maternal Uncle 107 - 56    SOCHx:   reports that he has quit smoking. His smoking use included cigarettes. His smokeless tobacco use includes chew. He reports that he does not currently use alcohol. He reports that he does not use drugs.  ALLERGIES:  Allergies  Allergen Reactions   Doxycycline Rash    ROS: Pertinent items noted in HPI and remainder of comprehensive ROS otherwise negative.  HOME MEDS: Current Outpatient Medications on File Prior to Visit  Medication Sig Dispense Refill   carvedilol (COREG) 25 MG tablet Take 1 tablet (25 mg total) by mouth 2 (two) times daily. 60 tablet 6   chlorthalidone (HYGROTON) 25 MG tablet Take 1 tablet (25 mg total) by mouth daily. 90 tablet 0   clopidogrel (PLAVIX) 75 MG tablet Take 1 tablet (75 mg total) by mouth daily. 30 tablet 6   Evolocumab (REPATHA SURECLICK) 140 MG/ML SOAJ Inject 140 mg into the skin every 14 (fourteen) days. 2 mL 11   fenofibrate micronized (LOFIBRA) 134 MG capsule Take 1 capsule (134 mg total) by mouth daily before breakfast. 90 capsule 0   ibuprofen (ADVIL) 200 MG tablet Take 800 mg by mouth 2 (two) times daily as needed for moderate pain.     icosapent Ethyl (VASCEPA) 1 g capsule Take 2 capsules (2 g total) by mouth 2 (two) times daily. 360 capsule 3   losartan (COZAAR) 50 MG tablet Take 1 tablet (50 mg total) by mouth 2 (two) times daily. 30 tablet 6   nitroGLYCERIN (NITROSTAT) 0.4 MG SL tablet Place 1 tablet (0.4 mg total)  under the tongue every 5 (five) minutes as needed for chest pain. 25 tablet 5   pantoprazole (PROTONIX) 40 MG tablet Take 1 tablet (40 mg total) by mouth daily. 90 tablet 1   rosuvastatin (CRESTOR) 20 MG tablet Take 1 tablet (20 mg total) by mouth daily. 30 tablet 6   No current facility-administered medications on file prior to visit.    LABS/IMAGING: No results found for this or any previous visit (from the past 48 hour(s)). No results found.  LIPID PANEL:    Component Value Date/Time   CHOL 118  01/18/2023 0931   TRIG 278 (H) 01/18/2023 0931   HDL 25 (L) 01/18/2023 0931   CHOLHDL 4.7 01/18/2023 0931   CHOLHDL 6.4 03/19/2022 0841   VLDL 75 (H) 03/19/2022 0841   LDLCALC 50 01/18/2023 0931    WEIGHTS: Wt Readings from Last 3 Encounters:  02/01/23 (!) 343 lb 11.2 oz (155.9 kg)  01/18/23 (!) 342 lb 6.4 oz (155.3 kg)  10/08/22 (!) 337 lb 1.3 oz (152.9 kg)    VITALS: BP 128/82 (BP Location: Right Arm, Patient Position: Sitting, Cuff Size: Large)   Pulse 79   Ht 6' (1.829 m)   Wt (!) 343 lb 11.2 oz (155.9 kg)   SpO2 97%   BMI 46.61 kg/m   EXAM: Deferred  EKG: Deferred  ASSESSMENT: Mixed dyslipidemia, very high risk with goal LDL less than 55 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   Mr. Kiplinger has had further improvement in his lipids on Repatha in addition to fenofibrate and rosuvastatin and Vascepa.  I will continue all 4 of these medications for this.  I would like to repeat the LP(a) today as I suspect it may be lower on Repatha.  Overall it sounds like he is not having any anginal symptoms.  He is on Plavix monotherapy which was recommended after 6 months of DAPT.  He should likely be able to switch to low-dose aspirin in October and come off of the clopidogrel.  I will communicate with his  primary cardiologist about this.  Plan otherwise to follow-up with me in 6 months or sooner as necessary.  Chrystie Nose, MD, [facility], FACP  [place]  [facility]  Medical Director of the Advanced Lipid Disorders &  Cardiovascular Risk Reduction Clinic Diplomate of the American Board of Clinical Lipidology Attending Cardiologist  Direct Dial: [phone]  Fax: [phone]  Website:  [email or web address] 02/01/2023, 9:40 AM

## 2023-02-01 NOTE — Patient Instructions (Signed)
Medication Instructions:  STOP- Plavix on 03/20/2023 START- Aspirin 81 mg by mouth daily after discontinuing Plavix  *If you need a refill on your cardiac medications before your next appointment, please call your pharmacy*   Lab Work: LP(a) today NMR in 6 Months  If you have labs (blood work) drawn today and your tests are completely normal, you will receive your results only by: MyChart Message (if you have MyChart) OR A paper copy in the mail If you have any lab test that is abnormal or we need to change your treatment, we will call you to review the results.   Testing/Procedures: None Ordered   Follow-Up: At [facility], you and your health needs are our priority.  As part of our continuing mission to provide you with exceptional heart care, we have created designated Provider Care Teams.  These Care Teams include your primary Cardiologist (physician) and Advanced Practice Providers (APPs -  Physician Assistants and Nurse Practitioners) who all work together to provide you with the care you need, when you need it.  We recommend signing up for the patient portal called "MyChart".  Sign up information is provided on this After Visit Summary.  MyChart is used to connect with patients for Virtual Visits (Telemedicine).  Patients are able to view lab/test results, encounter notes, upcoming appointments, etc.  Non-urgent messages can be sent to your provider as well.   To learn more about what you can do with MyChart, go to [email or web address].    Your next appointment:   6 month(s)  Provider:   K. Italy Hilty, MD    Other Instructions

## 2023-02-02 LAB — LIPOPROTEIN A (LPA): Lipoprotein (a): 109.6 nmol/L — ABNORMAL HIGH (ref <75.0)

## 2023-02-22 ENCOUNTER — Other Ambulatory Visit (HOSPITAL_COMMUNITY): Payer: Self-pay

## 2023-02-28 ENCOUNTER — Other Ambulatory Visit: Payer: Self-pay

## 2023-03-07 ENCOUNTER — Other Ambulatory Visit (HOSPITAL_COMMUNITY): Payer: Self-pay

## 2023-03-07 ENCOUNTER — Other Ambulatory Visit (HOSPITAL_BASED_OUTPATIENT_CLINIC_OR_DEPARTMENT_OTHER): Payer: Self-pay

## 2023-04-05 ENCOUNTER — Other Ambulatory Visit: Payer: Self-pay

## 2023-05-15 ENCOUNTER — Other Ambulatory Visit: Payer: Self-pay | Admitting: Cardiology

## 2023-05-17 ENCOUNTER — Other Ambulatory Visit (HOSPITAL_BASED_OUTPATIENT_CLINIC_OR_DEPARTMENT_OTHER): Payer: Self-pay

## 2023-05-17 MED ORDER — PANTOPRAZOLE SODIUM 40 MG PO TBEC
40.0000 mg | DELAYED_RELEASE_TABLET | Freq: Every day | ORAL | 2 refills | Status: DC
  Filled 2023-05-17: qty 30, 30d supply, fill #0
  Filled 2023-06-14: qty 30, 30d supply, fill #1
  Filled 2023-07-25: qty 30, 30d supply, fill #2
  Filled 2023-09-05: qty 30, 30d supply, fill #3

## 2023-05-19 ENCOUNTER — Other Ambulatory Visit: Payer: Self-pay | Admitting: Cardiology

## 2023-05-20 ENCOUNTER — Other Ambulatory Visit: Payer: Self-pay | Admitting: Cardiology

## 2023-05-31 ENCOUNTER — Other Ambulatory Visit: Payer: Self-pay | Admitting: Cardiology

## 2023-06-16 ENCOUNTER — Other Ambulatory Visit: Payer: Self-pay

## 2023-06-28 ENCOUNTER — Other Ambulatory Visit: Payer: Self-pay | Admitting: Adult Health

## 2023-07-28 ENCOUNTER — Other Ambulatory Visit (HOSPITAL_BASED_OUTPATIENT_CLINIC_OR_DEPARTMENT_OTHER): Payer: Self-pay

## 2023-08-04 LAB — NMR, LIPOPROFILE
Cholesterol, Total: 95 mg/dL — ABNORMAL LOW (ref 100–199)
HDL Particle Number: 25.9 umol/L — ABNORMAL LOW (ref >=30.5)
HDL-C: 23 mg/dL — ABNORMAL LOW (ref >39)
LDL Particle Number: 627 nmol/L (ref <1000)
LDL Size: 19.7 nm — ABNORMAL LOW (ref >20.5)
LDL-C (NIH Calc): 39 mg/dL (ref 0–99)
LP-IR Score: 77 — ABNORMAL HIGH (ref <=45)
Small LDL Particle Number: 515 nmol/L (ref <=527)
Triglycerides: 202 mg/dL — ABNORMAL HIGH (ref 0–149)

## 2023-08-08 ENCOUNTER — Ambulatory Visit (HOSPITAL_BASED_OUTPATIENT_CLINIC_OR_DEPARTMENT_OTHER): Payer: 59 | Admitting: Internal Medicine

## 2023-08-08 VITALS — BP 114/74 | HR 90 | Ht 72.0 in | Wt 317.0 lb

## 2023-08-08 DIAGNOSIS — E785 Hyperlipidemia, unspecified: Secondary | ICD-10-CM | POA: Diagnosis not present

## 2023-08-08 DIAGNOSIS — E7841 Elevated Lipoprotein(a): Secondary | ICD-10-CM | POA: Diagnosis not present

## 2023-08-08 DIAGNOSIS — Z6841 Body Mass Index (BMI) 40.0 and over, adult: Secondary | ICD-10-CM

## 2023-08-08 DIAGNOSIS — I25119 Atherosclerotic heart disease of native coronary artery with unspecified angina pectoris: Secondary | ICD-10-CM | POA: Diagnosis not present

## 2023-08-08 NOTE — Progress Notes (Signed)
 LIPID CLINIC CONSULT NOTE  Chief Complaint:  Follow-up dyslipidemia  Primary Care Physician: Renford Dills, MD  Primary Cardiologist:  Bryan Lemma, MD  HPI:  Jacob Carlson is a 43 y.o. male who is being seen today for the evaluation of dyslipidemia at the request of Renford Dills, MD. this a pleasant 43 year old male unfortunately presented with unstable angina last year and underwent cardiac catheterization in October.  This showed a proximal RCA ulcerated plaque which was stented.  He had a 50% distal LAD lesion as well.  He has a strong family history of heart disease including his father dying when he was 6 months old and his mother who also had an MI at a younger age.  Her brother (his uncle) also had an MI and was considered fairly healthy.  He does have a dyslipidemia with severely elevated triglycerides in the past.  His triglycerides have been as high as 1108.  Most recently had repeat labs which showed total cholesterol 170, triglycerides 436, HDL 24 and LDL 76.  An LP(a) was drawn as well and this was elevated at 118 nmol/L.  He was instructed to increase his rosuvastatin up to 20 mg daily and add 3 g of fish oil.  He has been taking that.  He still notes some intermittent issues with discomfort in the left upper chest particularly when eating certain meals.  He works as a Games developer.  He is less active than he was in the past when he used to mountain bike fairly regularly after he had children.  Diet is variable and he is trying to diet to lose weight as well.  He reports being a former smoker but quit a number of years ago and previously used frequent alcohol but has not had any in the last 8 months.  02/01/2023  Jacob Carlson returns today for follow-up of his dyslipidemia.  Overall he seems to be doing well.  He recently saw Lorin Picket, DNP who had made some other additional cardiac recommendations.  He is tolerating atorvastatin without issues.  He was supposed to  have repeat NMR and LP(a) but had a regular lipid 2 weeks ago.  This showed total cholesterol 118, triglycerides 278 (down from as high as 436-6 months ago), HDL 25 and LDL 50.  He is now at target on combination therapy with the addition of Repatha.  He will reach 1 year since stenting in October.  08/08/2023  Jacob Carlson is seen today in follow-up.  He continues to do well on triple therapy for his lipids.  LDL particle numbers come down substantially to 627 with an LDL of 39, low HDL 23 and high triglycerides of 202.  He reports that he was tested for testosterone which was noted to be low.  His provider then gave him a dose of Depo testosterone which she said caused him feeling hot all over and left-sided chest pain.  He decided to stop it after that.  PMHx:  Past Medical History:  Diagnosis Date   Coronary artery disease involving native heart with angina pectoris (HCC) 04/06/2022   Cardiac Cath 03/19/2022: Proximal RCA 75% ulcerated plaque (DES PCI Synergy DES 4.0 x 16-4.2 mm).  Distal LAD 50% (not responsive).  EF 55 to 65%.  Normal LVEDP.     Dizziness    Essential hypertension 09/28/2011   GERD (gastroesophageal reflux disease)    Hypercholesterolemia with hypertriglyceridemia 03/04/2012   Severely Elevated TG 1108   Morbid obesity with BMI of  40.0-44.9, adult [facility])    Swelling     Past Surgical History:  Procedure Laterality Date   ADENOIDECTOMY     CORONARY STENT INTERVENTION N/A 03/19/2022   Procedure: CORONARY STENT INTERVENTION;  Surgeon: Marykay Lex, MD;  Location: [facility] INVASIVE CV LAB;  Service: Cardiovascular;: Proximal RCA 75% ulcerated plaque (DES PCI Synergy DES 4.0 x 16-4.2 mm).   KNEE ARTHROSCOPY WITH MEDIAL MENISECTOMY Right 10/08/2022   Procedure: KNEE ARTHROSCOPY WITH PARTIAL  MEDIAL MENISECTOMY;  Surgeon: Yolonda Kida, MD;  Location: [facility];  Service: Orthopedics;  Laterality: Right;  45   LEFT HEART CATH AND CORONARY ANGIOGRAPHY N/A  03/19/2022   Procedure: LEFT HEART CATH AND CORONARY ANGIOGRAPHY;  Surgeon: Marykay Lex, MD;  Location: [facility] INVASIVE CV LAB;  Service: Cardiovascular;;: Proximal RCA 75% ulcerated plaque (DES PCI).  Distal LAD 50% (not responsive).  EF 55 to 65%.  Normal LVEDP.   TONSILLECTOMY     TRANSTHORACIC ECHOCARDIOGRAM  03/31/2022   EF 60 to 65%.  Normal LV size and function.  No RWMA.  Mild LVH.  Normal D Fxn.  Mild RV enlargement but unable assess PAP.  Normal MV.  Normal AOV.  Normal RAP.  NORMAL    FAMHx:  Family History  Problem Relation Age of Onset   Coronary artery disease Mother 49 - 10       was non-smoker & otherwise health   Heart attack Mother 47 - 67       No diabetes   Sudden Cardiac Death Mother    Heart attack Father    Heart block Father    Atrial fibrillation Maternal Aunt 30       Apparently had complications and issues with her A-fib that eventually caused her death.   Heart attack Maternal Uncle 50 06-Sep-2054   Sudden Cardiac Death Maternal Uncle 68 - 10    SOCHx:   reports that he has quit smoking. His smoking use included cigarettes. His smokeless tobacco use includes chew. He reports that he does not currently use alcohol. He reports that he does not use drugs.  ALLERGIES:  Allergies  Allergen Reactions   Doxycycline Rash    ROS: Pertinent items noted in HPI and remainder of comprehensive ROS otherwise negative.  HOME MEDS: Current Outpatient Medications on File Prior to Visit  Medication Sig Dispense Refill   aspirin 81 MG chewable tablet Chew by mouth daily.     carvedilol (COREG) 25 MG tablet TAKE 1 TABLET BY MOUTH TWICE A DAY 60 tablet 2   chlorthalidone (HYGROTON) 25 MG tablet TAKE 1 TABLET (25 MG TOTAL) BY MOUTH DAILY. 90 tablet 2   Evolocumab (REPATHA SURECLICK) 140 MG/ML SOAJ Inject 140 mg into the skin every 14 (fourteen) days. 2 mL 11   fenofibrate micronized (LOFIBRA) 134 MG capsule TAKE 1 CAPSULE BY MOUTH DAILY BEFORE BREAKFAST. 30 capsule 2    ibuprofen (ADVIL) 200 MG tablet Take 800 mg by mouth 2 (two) times daily as needed for moderate pain.     icosapent Ethyl (VASCEPA) 1 g capsule Take 2 capsules (2 g total) by mouth 2 (two) times daily. 360 capsule 3   losartan (COZAAR) 50 MG tablet TAKE 1 TABLET BY MOUTH TWICE A DAY 60 tablet 7   nitroGLYCERIN (NITROSTAT) 0.4 MG SL tablet Place 1 tablet (0.4 mg total) under the tongue every 5 (five) minutes as needed for chest pain. 25 tablet 5   pantoprazole (PROTONIX) 40 MG tablet Take 1  tablet (40 mg total) by mouth daily. 90 tablet 2   rosuvastatin (CRESTOR) 20 MG tablet Take 1 tablet (20 mg total) by mouth daily. 30 tablet 6   clopidogrel (PLAVIX) 75 MG tablet Take 1 tablet (75 mg total) by mouth daily. (Patient not taking: Reported on 08/08/2023) 30 tablet 6   No current facility-administered medications on file prior to visit.    LABS/IMAGING: No results found for this or any previous visit (from the past 48 hours). No results found.  LIPID PANEL:    Component Value Date/Time   CHOL 118 01/18/2023 0931   TRIG 278 (H) 01/18/2023 0931   HDL 25 (L) 01/18/2023 0931   CHOLHDL 4.7 01/18/2023 0931   CHOLHDL 6.4 03/19/2022 0841   VLDL 75 (H) 03/19/2022 0841   LDLCALC 50 01/18/2023 0931    WEIGHTS: Wt Readings from Last 3 Encounters:  08/08/23 (!) 317 lb (143.8 kg)  02/01/23 (!) 343 lb 11.2 oz (155.9 kg)  01/18/23 (!) 342 lb 6.4 oz (155.3 kg)    VITALS: BP 114/74 (BP Location: Right Arm, Patient Position: Sitting, Cuff Size: Large)   Pulse 90   Ht 6' (1.829 m)   Wt (!) 317 lb (143.8 kg)   SpO2 93%   BMI 42.99 kg/m   EXAM: Deferred  EKG: Deferred  ASSESSMENT: Mixed dyslipidemia, very high risk with goal LDL less than 55 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   Jacob Carlson  seems to be doing well with much improved dyslipidemia.  He had mild improvement in LP(a) with Repatha down to 109 nmol/L but his triglycerides are now much better in 202.  LDL is now at target of 39 (less than 55).  He would still benefit from regular exercise and more weight loss.  He said at 1 point his weight was down to 280 but just in the past several months had gone up.  Plan follow-up with Eligha Bridegroom, NP in 1 year or sooner as necessary.  Chrystie Nose, MD, [facility], FACP  [place]  [facility]  Medical Director of the Advanced Lipid Disorders &  Cardiovascular Risk Reduction Clinic Diplomate of the American Board of Clinical Lipidology Attending Cardiologist  Direct Dial: [phone]  Fax: [phone]  Website:  [email or web address] 08/08/2023, 9:39 AM

## 2023-08-08 NOTE — Patient Instructions (Signed)
 Medication Instructions:  NO CHANGES  *If you need a refill on your cardiac medications before your next appointment, please call your pharmacy*   Lab Work: FASTING lab work in 1 year -- before next appointment     Follow-Up: At [facility], you and your health needs are our priority.  As part of our continuing mission to provide you with exceptional heart care, we have created designated Provider Care Teams.  These Care Teams include your primary Cardiologist (physician) and Advanced Practice Providers (APPs -  Physician Assistants and Nurse Practitioners) who all work together to provide you with the care you need, when you need it.  We recommend signing up for the patient portal called "MyChart".  Sign up information is provided on this After Visit Summary.  MyChart is used to connect with patients for Virtual Visits (Telemedicine).  Patients are able to view lab/test results, encounter notes, upcoming appointments, etc.  Non-urgent messages can be sent to your provider as well.   To learn more about what you can do with MyChart, go to [email or web address].    Your next appointment:   12 months with Jacob Bridegroom, NP -- lipid clinic

## 2023-08-22 MED ORDER — ICOSAPENT ETHYL 1 G PO CAPS: 2.0000 g | ORAL_CAPSULE | Freq: Two times a day (BID) | ORAL | 3 refills | Status: DC

## 2023-08-22 MED ORDER — FENOFIBRATE MICRONIZED 134 MG PO CAPS: 134.0000 mg | ORAL_CAPSULE | Freq: Every day | ORAL | 3 refills | Status: AC

## 2023-08-22 MED ORDER — REPATHA SURECLICK 140 MG/ML ~~LOC~~ SOAJ: 140.0000 mg | SUBCUTANEOUS | 11 refills | Status: AC

## 2023-08-22 MED ORDER — ROSUVASTATIN CALCIUM 20 MG PO TABS: 20.0000 mg | ORAL_TABLET | Freq: Every day | ORAL | 3 refills | Status: AC

## 2023-09-01 ENCOUNTER — Other Ambulatory Visit: Payer: Self-pay

## 2023-09-01 MED ORDER — ICOSAPENT ETHYL 1 G PO CAPS: 2.0000 g | ORAL_CAPSULE | Freq: Two times a day (BID) | ORAL | 3 refills | Status: AC

## 2023-09-05 ENCOUNTER — Other Ambulatory Visit: Payer: Self-pay

## 2023-09-05 MED ORDER — PANTOPRAZOLE SODIUM 40 MG PO TBEC: 40.0000 mg | DELAYED_RELEASE_TABLET | Freq: Every day | ORAL | 3 refills | Status: AC

## 2023-09-05 MED ORDER — CHLORTHALIDONE 25 MG PO TABS: 25.0000 mg | ORAL_TABLET | Freq: Every day | ORAL | 3 refills | Status: AC

## 2023-09-05 MED ORDER — CARVEDILOL 25 MG PO TABS: 25.0000 mg | ORAL_TABLET | Freq: Two times a day (BID) | ORAL | 3 refills | Status: AC

## 2023-09-05 MED ORDER — LOSARTAN POTASSIUM 50 MG PO TABS: 50.0000 mg | ORAL_TABLET | Freq: Two times a day (BID) | ORAL | 3 refills | Status: AC

## 2023-09-06 ENCOUNTER — Other Ambulatory Visit (HOSPITAL_BASED_OUTPATIENT_CLINIC_OR_DEPARTMENT_OTHER): Payer: Self-pay

## 2023-09-07 ENCOUNTER — Other Ambulatory Visit (HOSPITAL_COMMUNITY): Payer: Self-pay

## 2023-09-07 NOTE — Telephone Encounter (Signed)
 Looks like his plan limits the day supply on the fill to 30 days max (90 day supply not permitted). 30 day supply is $5.74
